# Patient Record
Sex: Male | Born: 1960 | Race: White | Hispanic: No | Smoking: Current every day smoker
Health system: Southern US, Community
[De-identification: ages and names within clinical notes are randomized; demographics above are authoritative.]

## PROBLEM LIST (undated history)

## (undated) DIAGNOSIS — Z720 Tobacco use: Secondary | ICD-10-CM

## (undated) DIAGNOSIS — F1010 Alcohol abuse, uncomplicated: Secondary | ICD-10-CM

## (undated) HISTORY — PX: OTHER SURGICAL HISTORY: SHX169

---

## 2019-05-15 ENCOUNTER — Inpatient Hospital Stay (HOSPITAL_COMMUNITY)
Admission: AD | Admit: 2019-05-15 | Discharge: 2019-05-29 | DRG: 208 | Disposition: E | Payer: BLUE CROSS/BLUE SHIELD | Source: Other Acute Inpatient Hospital | Attending: Pulmonary Disease | Admitting: Pulmonary Disease

## 2019-05-15 ENCOUNTER — Inpatient Hospital Stay (HOSPITAL_COMMUNITY): Payer: BLUE CROSS/BLUE SHIELD

## 2019-05-15 ENCOUNTER — Encounter (HOSPITAL_COMMUNITY): Payer: Self-pay | Admitting: Physician Assistant

## 2019-05-15 DIAGNOSIS — R319 Hematuria, unspecified: Secondary | ICD-10-CM | POA: Diagnosis not present

## 2019-05-15 DIAGNOSIS — F1010 Alcohol abuse, uncomplicated: Secondary | ICD-10-CM | POA: Diagnosis present

## 2019-05-15 DIAGNOSIS — Z8249 Family history of ischemic heart disease and other diseases of the circulatory system: Secondary | ICD-10-CM | POA: Diagnosis not present

## 2019-05-15 DIAGNOSIS — G92 Toxic encephalopathy: Secondary | ICD-10-CM | POA: Diagnosis not present

## 2019-05-15 DIAGNOSIS — J9690 Respiratory failure, unspecified, unspecified whether with hypoxia or hypercapnia: Secondary | ICD-10-CM

## 2019-05-15 DIAGNOSIS — F1020 Alcohol dependence, uncomplicated: Secondary | ICD-10-CM

## 2019-05-15 DIAGNOSIS — J9601 Acute respiratory failure with hypoxia: Principal | ICD-10-CM

## 2019-05-15 DIAGNOSIS — G931 Anoxic brain damage, not elsewhere classified: Secondary | ICD-10-CM | POA: Diagnosis not present

## 2019-05-15 DIAGNOSIS — E8770 Fluid overload, unspecified: Secondary | ICD-10-CM | POA: Diagnosis not present

## 2019-05-15 DIAGNOSIS — Z978 Presence of other specified devices: Secondary | ICD-10-CM

## 2019-05-15 DIAGNOSIS — I214 Non-ST elevation (NSTEMI) myocardial infarction: Secondary | ICD-10-CM | POA: Diagnosis not present

## 2019-05-15 DIAGNOSIS — I4510 Unspecified right bundle-branch block: Secondary | ICD-10-CM | POA: Diagnosis present

## 2019-05-15 DIAGNOSIS — Z85828 Personal history of other malignant neoplasm of skin: Secondary | ICD-10-CM | POA: Diagnosis not present

## 2019-05-15 DIAGNOSIS — Z825 Family history of asthma and other chronic lower respiratory diseases: Secondary | ICD-10-CM | POA: Diagnosis not present

## 2019-05-15 DIAGNOSIS — F17210 Nicotine dependence, cigarettes, uncomplicated: Secondary | ICD-10-CM | POA: Diagnosis not present

## 2019-05-15 DIAGNOSIS — Z20828 Contact with and (suspected) exposure to other viral communicable diseases: Secondary | ICD-10-CM | POA: Diagnosis not present

## 2019-05-15 DIAGNOSIS — Z823 Family history of stroke: Secondary | ICD-10-CM

## 2019-05-15 DIAGNOSIS — J181 Lobar pneumonia, unspecified organism: Secondary | ICD-10-CM

## 2019-05-15 DIAGNOSIS — E872 Acidosis: Secondary | ICD-10-CM | POA: Diagnosis not present

## 2019-05-15 DIAGNOSIS — Z515 Encounter for palliative care: Secondary | ICD-10-CM | POA: Diagnosis not present

## 2019-05-15 DIAGNOSIS — N179 Acute kidney failure, unspecified: Secondary | ICD-10-CM | POA: Diagnosis not present

## 2019-05-15 DIAGNOSIS — J189 Pneumonia, unspecified organism: Secondary | ICD-10-CM | POA: Diagnosis present

## 2019-05-15 DIAGNOSIS — J9602 Acute respiratory failure with hypercapnia: Secondary | ICD-10-CM

## 2019-05-15 DIAGNOSIS — I420 Dilated cardiomyopathy: Secondary | ICD-10-CM | POA: Diagnosis not present

## 2019-05-15 DIAGNOSIS — R739 Hyperglycemia, unspecified: Secondary | ICD-10-CM | POA: Diagnosis present

## 2019-05-15 DIAGNOSIS — I469 Cardiac arrest, cause unspecified: Secondary | ICD-10-CM

## 2019-05-15 DIAGNOSIS — I519 Heart disease, unspecified: Secondary | ICD-10-CM | POA: Diagnosis not present

## 2019-05-15 DIAGNOSIS — I82409 Acute embolism and thrombosis of unspecified deep veins of unspecified lower extremity: Secondary | ICD-10-CM | POA: Diagnosis not present

## 2019-05-15 DIAGNOSIS — R9431 Abnormal electrocardiogram [ECG] [EKG]: Secondary | ICD-10-CM

## 2019-05-15 HISTORY — DX: Tobacco use: Z72.0

## 2019-05-15 HISTORY — DX: Alcohol abuse, uncomplicated: F10.10

## 2019-05-15 LAB — GLUCOSE, CAPILLARY
Glucose-Capillary: 172 mg/dL — ABNORMAL HIGH (ref 70–99)
Glucose-Capillary: 192 mg/dL — ABNORMAL HIGH (ref 70–99)
Glucose-Capillary: 168 mg/dL — ABNORMAL HIGH (ref 70–99)
Glucose-Capillary: 177 mg/dL — ABNORMAL HIGH (ref 70–99)

## 2019-05-15 LAB — POCT I-STAT 7, (LYTES, BLD GAS, ICA,H+H)
Acid-base deficit: 4 mmol/L — ABNORMAL HIGH (ref 0.0–2.0)
Acid-base deficit: 3 mmol/L — ABNORMAL HIGH (ref 0.0–2.0)
Bicarbonate: 22.8 mmol/L (ref 20.0–28.0)
Bicarbonate: 22.2 mmol/L (ref 20.0–28.0)
Calcium, Ion: 1.12 mmol/L — ABNORMAL LOW (ref 1.15–1.40)
Calcium, Ion: 1.1 mmol/L — ABNORMAL LOW (ref 1.15–1.40)
HCT: 43 % (ref 39.0–52.0)
HCT: 43 % (ref 39.0–52.0)
Hemoglobin: 14.6 g/dL (ref 13.0–17.0)
Hemoglobin: 14.6 g/dL (ref 13.0–17.0)
O2 Saturation: 86 %
O2 Saturation: 98 %
Potassium: 5.1 mmol/L (ref 3.5–5.1)
Potassium: 4.9 mmol/L (ref 3.5–5.1)
Sodium: 137 mmol/L (ref 135–145)
Sodium: 138 mmol/L (ref 135–145)
TCO2: 24 mmol/L (ref 22–32)
TCO2: 23 mmol/L (ref 22–32)
pCO2 arterial: 46.3 mmHg (ref 32.0–48.0)
pCO2 arterial: 39.8 mmHg (ref 32.0–48.0)
pH, Arterial: 7.3 — ABNORMAL LOW (ref 7.350–7.450)
pH, Arterial: 7.355 (ref 7.350–7.450)
pO2, Arterial: 57 mmHg — ABNORMAL LOW (ref 83.0–108.0)
pO2, Arterial: 109 mmHg — ABNORMAL HIGH (ref 83.0–108.0)

## 2019-05-15 LAB — RESPIRATORY PANEL BY PCR

## 2019-05-15 LAB — PROCALCITONIN: Procalcitonin: 1.9 ng/mL

## 2019-05-15 LAB — LACTIC ACID, PLASMA
Lactic Acid, Venous: 3.2 mmol/L (ref 0.5–1.9)
Lactic Acid, Venous: 2.5 mmol/L (ref 0.5–1.9)

## 2019-05-15 LAB — RENAL FUNCTION PANEL
Albumin: 3.2 g/dL — ABNORMAL LOW (ref 3.5–5.0)
Anion gap: 14 (ref 5–15)
BUN: 14 mg/dL (ref 6–20)
CO2: 18 mmol/L — ABNORMAL LOW (ref 22–32)
Calcium: 7.8 mg/dL — ABNORMAL LOW (ref 8.9–10.3)
Chloride: 104 mmol/L (ref 98–111)
Creatinine, Ser: 1.28 mg/dL — ABNORMAL HIGH (ref 0.61–1.24)
GFR calc Af Amer: >60 mL/min (ref >60)
GFR calc non Af Amer: >60 mL/min (ref >60)
Glucose, Bld: 188 mg/dL — ABNORMAL HIGH (ref 70–99)
Phosphorus: 2 mg/dL — ABNORMAL LOW (ref 2.5–4.6)
Potassium: 5.1 mmol/L (ref 3.5–5.1)
Sodium: 136 mmol/L (ref 135–145)

## 2019-05-15 LAB — FERRITIN: Ferritin: 535 ng/mL — ABNORMAL HIGH (ref 24–336)

## 2019-05-15 LAB — HEMOGLOBIN A1C
Hgb A1c MFr Bld: 5.5 % (ref 4.8–5.6)
Mean Plasma Glucose: 111.15 mg/dL

## 2019-05-15 LAB — TROPONIN I
Troponin I: 1.5 ng/mL (ref <0.03)
Troponin I: 3.14 ng/mL (ref <0.03)

## 2019-05-15 LAB — AMMONIA: Ammonia: 43 umol/L — ABNORMAL HIGH (ref 9–35)

## 2019-05-15 LAB — LACTATE DEHYDROGENASE: LDH: 262 U/L — ABNORMAL HIGH (ref 98–192)

## 2019-05-15 LAB — STREP PNEUMONIAE URINARY ANTIGEN: Strep Pneumo Urinary Antigen: NEGATIVE

## 2019-05-15 LAB — MRSA PCR SCREENING: MRSA by PCR: NEGATIVE

## 2019-05-15 LAB — MAGNESIUM: Magnesium: 2.3 mg/dL (ref 1.7–2.4)

## 2019-05-15 LAB — C-REACTIVE PROTEIN: CRP: 0.9 mg/dL (ref <1.0)

## 2019-05-15 MED ORDER — IPRATROPIUM-ALBUTEROL 0.5-2.5 (3) MG/3ML IN SOLN
3.0000 mL | Freq: Four times a day (QID) | RESPIRATORY_TRACT | Status: DC
  Administered 2019-05-15 – 2019-05-18 (×11): 3 mL via RESPIRATORY_TRACT
  Filled 2019-05-15 (×11): qty 3

## 2019-05-15 MED ORDER — ALBUTEROL SULFATE (2.5 MG/3ML) 0.083% IN NEBU
2.5000 mg | INHALATION_SOLUTION | RESPIRATORY_TRACT | Status: DC | PRN
  Administered 2019-05-17: 13:00:00 2.5 mg via RESPIRATORY_TRACT
  Filled 2019-05-15 (×2): qty 3

## 2019-05-15 MED ORDER — FENTANYL CITRATE (PF) 100 MCG/2ML IJ SOLN
50.0000 ug | INTRAMUSCULAR | Status: DC | PRN
  Administered 2019-05-15 – 2019-05-18 (×18): 100 ug via INTRAVENOUS
  Filled 2019-05-15 (×6): qty 2
  Filled 2019-05-15: qty 4
  Filled 2019-05-15 (×11): qty 2

## 2019-05-15 MED ORDER — SODIUM CHLORIDE 0.9 % IV SOLN
INTRAVENOUS | Status: DC
  Administered 2019-05-15 – 2019-05-17 (×4): via INTRAVENOUS

## 2019-05-15 MED ORDER — THIAMINE HCL 100 MG/ML IJ SOLN
100.0000 mg | Freq: Every day | INTRAMUSCULAR | Status: DC
  Administered 2019-05-15 – 2019-05-18 (×4): 100 mg via INTRAVENOUS
  Filled 2019-05-15 (×4): qty 2

## 2019-05-15 MED ORDER — HEPARIN SODIUM (PORCINE) 5000 UNIT/ML IJ SOLN
5000.0000 [IU] | Freq: Three times a day (TID) | INTRAMUSCULAR | Status: DC
  Administered 2019-05-15 – 2019-05-16 (×2): 5000 [IU] via SUBCUTANEOUS
  Filled 2019-05-15 (×2): qty 1

## 2019-05-15 MED ORDER — PANTOPRAZOLE SODIUM 40 MG IV SOLR
40.0000 mg | Freq: Every day | INTRAVENOUS | Status: DC
  Administered 2019-05-15 – 2019-05-17 (×3): 40 mg via INTRAVENOUS
  Filled 2019-05-15 (×4): qty 40

## 2019-05-15 MED ORDER — PROPOFOL 1000 MG/100ML IV EMUL
0.0000 ug/kg/min | INTRAVENOUS | Status: DC
  Administered 2019-05-15: 40 ug/kg/min via INTRAVENOUS
  Administered 2019-05-15 – 2019-05-16 (×3): 50 ug/kg/min via INTRAVENOUS
  Administered 2019-05-16: 25 ug/kg/min via INTRAVENOUS
  Administered 2019-05-16: 05:00:00 50 ug/kg/min via INTRAVENOUS
  Filled 2019-05-15 (×4): qty 100

## 2019-05-15 MED ORDER — PROPOFOL 1000 MG/100ML IV EMUL
INTRAVENOUS | Status: AC
  Administered 2019-05-15: 14:00:00 50 ug/kg/min via INTRAVENOUS
  Filled 2019-05-15: qty 100

## 2019-05-15 MED ORDER — ASPIRIN 81 MG PO CHEW
81.0000 mg | CHEWABLE_TABLET | Freq: Every day | ORAL | Status: DC
  Administered 2019-05-16 – 2019-05-18 (×3): 81 mg via ORAL
  Filled 2019-05-15 (×3): qty 1

## 2019-05-15 MED ORDER — CHLORHEXIDINE GLUCONATE CLOTH 2 % EX PADS
6.0000 | MEDICATED_PAD | Freq: Every day | CUTANEOUS | Status: DC
  Administered 2019-05-15 – 2019-05-18 (×3): 6 via TOPICAL

## 2019-05-15 MED ORDER — ORAL CARE MOUTH RINSE
15.0000 mL | OROMUCOSAL | Status: DC
  Administered 2019-05-15 – 2019-05-18 (×28): 15 mL via OROMUCOSAL

## 2019-05-15 MED ORDER — FOLIC ACID 5 MG/ML IJ SOLN
1.0000 mg | Freq: Every day | INTRAMUSCULAR | Status: DC
  Administered 2019-05-15 – 2019-05-18 (×4): 1 mg via INTRAVENOUS
  Filled 2019-05-15 (×4): qty 0.2

## 2019-05-15 MED ORDER — INSULIN ASPART 100 UNIT/ML ~~LOC~~ SOLN
0.0000 [IU] | SUBCUTANEOUS | Status: DC
  Administered 2019-05-15 – 2019-05-16 (×4): 2 [IU] via SUBCUTANEOUS
  Administered 2019-05-16: 1 [IU] via SUBCUTANEOUS
  Administered 2019-05-16: 2 [IU] via SUBCUTANEOUS
  Administered 2019-05-16 – 2019-05-17 (×6): 1 [IU] via SUBCUTANEOUS
  Administered 2019-05-18 (×2): 2 [IU] via SUBCUTANEOUS

## 2019-05-15 MED ORDER — FENTANYL CITRATE (PF) 100 MCG/2ML IJ SOLN
50.0000 ug | INTRAMUSCULAR | Status: AC | PRN
  Administered 2019-05-16 – 2019-05-18 (×3): 50 ug via INTRAVENOUS
  Filled 2019-05-15 (×2): qty 2

## 2019-05-15 MED ORDER — ASPIRIN 81 MG PO CHEW
324.0000 mg | CHEWABLE_TABLET | Freq: Once | ORAL | Status: AC
  Administered 2019-05-15: 324 mg
  Filled 2019-05-15: qty 4

## 2019-05-15 MED ORDER — CHLORHEXIDINE GLUCONATE 0.12% ORAL RINSE (MEDLINE KIT)
15.0000 mL | Freq: Two times a day (BID) | OROMUCOSAL | Status: DC
  Administered 2019-05-15 – 2019-05-18 (×6): 15 mL via OROMUCOSAL

## 2019-05-15 NOTE — Consult Note (Addendum)
Cardiology Consultation:   Patient ID: Jermaine Everett MRN: [id number]; DOB: Mar 22, 1961  Admit date: 05/14/2019 Date of Consult: 05/24/2019  Primary Care Provider: System, Pcp Not In Primary Cardiologist: New  Primary Electrophysiologist:  None    Patient Profile:   Jermaine Everett is a 58 y.o. male with a hx of EtOH abuse and tobacco abuse who is being seen today for the evaluation of cardiac arrest at the request of Dr. Ander Slade.  History of Present Illness:   [name] is a 58 year old male with past medical history of EtOH and tobacco abuse.  Family reported patient has not been seen by a physician for over 20 years.  He drinks on average of 10-12 beers on a daily basis.  He currently works in a [employer] operational officer, there has been concerned that the place he works and had reported positive COVID-19 cases however patient himself has not had any recent symptoms.  He lives with one of his son.  He was found on the side of the road this morning by a coworker.  He was minimally responsive at the time.  However by EMS arrival, he became unresponsive and had PEA arrest.  He achieved ROSC after 1 rounds of CPR and epi.  He was intubated in the field via a King airway.  He was initially taken to [facility].  Initial blood work on arrival showed white blood cell count of 18.9, hemoglobin 16.5, platelet 146.  Sodium potassium were normal.  Hemoglobin 1.3.  Glucose 331.  Vital signs arrival included temperature of 98.2.  Pulse 109.  Respiratory rate 20.  BP 123/60.  Pulse 93.  proBNP came back elevated at 1480.  Borderline elevated amylase 112 with normal lipase.  Myoglobin elevated at 164.4.  Normal liver function, initial lactic acid was 12.1, subsequent lactic acid came down to 3.3.  Urinalysis showed 3+ glucose, 2+ protein, 2+ occult blood, negative nitrite.  D-dimer elevated at 3101 (normal range less than 500) Initial troponin borderline elevated to 0.29.  CPK total was possible MB 153 which  was within normal range.  pH 7.15, PCO2 70, PO2 71.  Blood cultures were obtained however results currently pending.  Chest x-ray showed airway tube in place without pneumothorax, patchy opacity in mid lung and the right base region suspect a degree of multifocal pneumonia.  Abdominal x-ray showed nasogastric tube without any bowel obstruction.  COVID-19 test was negative EKG showed J-point depression in lateral leads with sinus tachycardia.   Patient was subsequently transferred to [facility] for further evaluation.  Initial EKG on arrival shows sinus rhythm with heart rate 78, T wave inversion in the lateral leads, q wave in anterior leads. Baseline incomplete right bundle branch block.  Troponin [facility] has increased to 1.5.  Lactic acid stable at 3.2.  Procalcitonin 1.9.  Cardiology has been consulted for potential cardiac arrest.  CT of the head is currently pending.  Talking with his son and the wife, patient did have an episode of paroxysmal nocturnal dyspnea 2 weeks ago when he woke up in the middle of the night gasping for air.  This lasted roughly 3 hours before resolving and has not recurred since.  His family was not aware of any recent chest discomfort.   Past Medical History:  Diagnosis Date   ETOH abuse    Tobacco abuse     Past Surgical History:  Procedure Laterality Date   skin cancer removal  Home Medications:  Prior to Admission medications   Not on File    Inpatient Medications: Scheduled Meds:  [START ON 05/16/2019] aspirin  81 mg Oral Daily   chlorhexidine gluconate (MEDLINE KIT)  15 mL Mouth Rinse BID   [START ON 05/16/2019] Chlorhexidine Gluconate Cloth  6 each Topical R8309   folic acid  1 mg Intravenous Daily   heparin  5,000 Units Subcutaneous Q8H   insulin aspart  0-9 Units Subcutaneous Q4H   ipratropium-albuterol  3 mL Nebulization Q6H   mouth rinse  15 mL Mouth Rinse 10 times per day   pantoprazole (PROTONIX) IV  40 mg  Intravenous QHS   thiamine  100 mg Intravenous Daily   Continuous Infusions:  sodium chloride 75 mL/hr at 05/03/2019 1600   propofol (DIPRIVAN) infusion 50 mcg/kg/min (05/26/2019 1655)   PRN Meds: albuterol, fentaNYL (SUBLIMAZE) injection, fentaNYL (SUBLIMAZE) injection  Allergies:   Not on File  Social History:   Social History   Socioeconomic History   Marital status: Divorced    Spouse name: Not on file   Number of children: Not on file   Years of education: Not on file   Highest education level: Not on file  Occupational History   Not on file  Social Needs   Financial resource strain: Not on file   Food insecurity:    Worry: Not on file    Inability: Not on file   Transportation needs:    Medical: Not on file    Non-medical: Not on file  Tobacco Use   Smoking status: Current Every Day Smoker    Types: Cigarettes   Smokeless tobacco: Current User   Tobacco comment: 2-3 packes per day since age 28  Substance and Sexual Activity   Alcohol use: Yes    Comment: 10-12 beers per night   Drug use: Never   Sexual activity: Not on file  Lifestyle   Physical activity:    Days per week: Not on file    Minutes per session: Not on file   Stress: Not on file  Relationships   Social connections:    Talks on phone: Not on file    Gets together: Not on file    Attends religious service: Not on file    Active member of club or organization: Not on file    Attends meetings of clubs or organizations: Not on file    Relationship status: Not on file   Intimate partner violence:    Fear of current or ex partner: Not on file    Emotionally abused: Not on file    Physically abused: Not on file    Forced sexual activity: Not on file  Other Topics Concern   Not on file  Social History Narrative   Not on file    Family History:    Family History  Problem Relation Age of Onset   Heart disease Mother        heart valve issue   Stroke Father     Emphysema Maternal Grandfather      ROS:  Please see the history of present illness.   All other ROS reviewed and negative.     Physical Exam/Data:   Vitals:   05/06/2019 1530 05/11/2019 1600 05/25/2019 1630 05/10/2019 1700  BP: 109/75 115/77 120/72 129/78  Pulse: 77 78 77 76  Resp: '18 12 20 20  ' Temp:      TempSrc:      SpO2: 98% 98% 98% 99%  Weight:  Height:        Intake/Output Summary (Last 24 hours) at 05/21/2019 1733 Last data filed at 05/10/2019 1600 Gross per 24 hour  Intake 131.86 ml  Output --  Net 131.86 ml   Last 3 Weights 05/05/2019  Weight (lbs) 232 lb 5.8 oz  Weight (kg) 105.4 kg     Body mass index is 31.51 kg/m.  General: Intubated and sedated HEENT: normal Lymph: no adenopathy Neck: no JVD Endocrine:  No thryomegaly Vascular: No carotid bruits; FA pulses 2+ bilaterally without bruits  Cardiac:  normal S1, S2; RRR; no murmur Lungs: Intubated: Anterior exam shows lungs is clear Abd: soft, no hepatomegaly  Ext: no edema  Musculoskeletal:  No deformities Skin: warm and dry  Neuro:  Unable to assess Psych:  Unable to assess  EKG:  The EKG was personally reviewed and demonstrates: Normal sinus rhythm, incomplete right bundle branch block, T wave inversion in the lateral leads, q wave in anterior leads Telemetry:  Telemetry was personally reviewed and demonstrates: Normal sinus rhythm.  Relevant CV Studies:  N/A  Laboratory Data:  Chemistry Recent Labs  Lab 05/28/2019 1459 05/05/2019 1531  NA 136 137  K 5.1 5.1  CL 104  --   CO2 18*  --   GLUCOSE 188*  --   BUN 14  --   CREATININE 1.28*  --   CALCIUM 7.8*  --   GFRNONAA >60  --   GFRAA >60  --   ANIONGAP 14  --     Recent Labs  Lab 05/23/2019 1459  ALBUMIN 3.2*   Hematology Recent Labs  Lab 05/27/2019 1531  HGB 14.6  HCT 43.0   Cardiac Enzymes Recent Labs  Lab 05/08/2019 1456  TROPONINI 1.50*   No results for input(s): TROPIPOC in the last 168 hours.  BNPNo results for input(s):  BNP, PROBNP in the last 168 hours.  DDimer No results for input(s): DDIMER in the last 168 hours.  Radiology/Studies:  Dg Chest Port 1 View  Result Date: 05/14/2019 CLINICAL DATA:  Intubation. EXAM: PORTABLE CHEST 1 VIEW COMPARISON:  None. FINDINGS: The heart size and mediastinal contours are within normal limits. Endotracheal and nasogastric tubes are in grossly good position. No pneumothorax is noted. Mild bibasilar subsegmental atelectasis is noted with minimal left pleural effusion. The visualized skeletal structures are unremarkable. IMPRESSION: Endotracheal and nasogastric tubes are in grossly good position. Mild bibasilar subsegmental atelectasis is noted with minimal left pleural effusion. Electronically Signed   By: Marijo Conception M.D.   On: 05/06/2019 16:26    Assessment and Plan:   1. Possible cardiac arrest: Q wave in the anterior leads, pending echocardiogram. His presentation concerning for CHF respiratory arrest, although physical exam did not show significant sign of volume overload  2. Multifocal pneumonia: Seen on outside chest x-ray. Per PCCM  3. Tobacco abuse: per family, he has been smoking 2-3 packs per day since age 88  4. EtOH abuse: drinks about 10-12 beers a day      For questions or updates, please contact [place] Please consult [email or web address] for contact info under     Signed, Almyra Deforest, [place]  05/24/2019 5:33 PM   I have seen and examined the patient along with Almyra Deforest, PA.   I have reviewed the chart, notes and new data.  I agree with PA/NP's note.  Key new complaints: Intubated, sedated. History from chart. Possible acute event (MI?) 2 weeks ago, subsequent deterioration in respiratory status Key examination changes:  clear lungs, prolonged expiration, nonspecific response to pain only, Babinski upgoing bilaterally Key new findings / data: ECG (no old tracing) suggests od anteroseptal infarction, completed, with anterolateral T wave inversion  suggesting ischemia. BNP markedly elevated . COVID 19 test at [facility] negative. Unable to review CXr images from [facility] ("multifocal pneumonia" could be acute pulmonary edema). Current CXR with L pleural effusion.  PLAN: Check echocardiogram. Suspect delayed presentation of anterior MI with subsequent CHF, in setting of background smoking-related lung disease and alcoholism, complicated by PEA due to acute hypoxic and hypercapnic respiratory failure and lactic acidosis. There are some clinical signs that suggest poor neurological prognosis. Recommend echo as initial evaluation. ASA is appropriate. Heparin only for DVT prophylaxis. Would not proceed with any invasive coronary workup until he has meaningful neurological recovery.  Sanda Klein, MD, [place] [phone] 05/16/2019, 6:05 PM

## 2019-05-15 NOTE — H&P (Addendum)
NAME:  Jermaine Everett, MRN:  [id number], DOB:  01/21/1961, LOS: 0 ADMISSION DATE:  05/25/2019, CONSULTATION DATE:  05/26/2019 REFERRING MD:  [facility], CHIEF COMPLAINT:  Cardiac arrest  Brief History   58 year old male transferred from [facility] ER after being found with witness PEA arrest, ROSC after 1 round of epi.  Since remains intubated but not following commands.  COVID neg.  CXR with concerns for sepsis/  multifocal pneumonia.    History of present illness   HPI obtained from medical chart review and per conversation with patient's son, Jermaine Everett as patient is sedated on mechanical ventilation.   58 year old male with history of long standing tobacco and ETOH use who has not seen a medical provider in years per family.    Son states he complained of SOB at night about 2 weeks ago but since stated he felt better and seemed in his normal state of health last night.  No known reports of fever, chest pain, or shortness breath. Son reports he drinks 10-12 beers on a daily basis and more when off work.  Denies any illicit drugs.  He works in a factory, Biomedical scientist, Scientist, research (medical).  Family report concern as there as been positive COVID cases.  Patient is divorced and has three sons, one of whom he lives with.   He was found on the side of the road in his car this morning by a co-worker, minimally responsive.  On EMS arrival, he went unresponsive and found to be in PEA arrest.  ROSC obtained after 1 round of CPR/ EPI. Taken to [facility] ER around 0710, and remained unresponsive and intubated.  Abnormal labs noted for AG 24, sCr 1.3, glucose 331, Lactic 12 -> 3.3, trop 0.05- 0.29, BNP 1480, Amylase 112, WBC 18.9, normal coags, Ddimer 3101, UDS neg, UA neg, EKG non acute,  CXR concerning for multifocal pneumonia. Emperically started on vancomycin and zosyn, 2L NS, solumedrol, and started on propofol.  Unclear if patient has had purposeful movement.  Transferred to [facility] for further care.   Past Medical  History  Tobacco abuse   Significant Hospital Events   5/18 Cardiac arrest/ tx to [facility]  Consults:   Procedures:  5/18 ETT >>  Significant Diagnostic Tests:  5/18 [facility] >>  Micro Data:  5/18 COVID ([place]) >> neg  5/18 MRSA PCR >> 5/18 BCx2 >> 5/18 trach asp >> 5/18 RVP >>  Antimicrobials:  5/18 vanc x1 5/18 zosyn x1 5/18 ceftriaxone >> 5/18 azithromax  >>  Interim history/subjective:  Arrived on propofol, currently at 60 mcg/kg/min  Objective   Blood pressure 124/87, pulse 86, resp. rate (!) 24, height 6' (1.829 m), weight 105.4 kg, SpO2 98 %.    Vent Mode: PRVC FiO2 (%):  [50 %] 50 % Set Rate:  [18 bmp] 18 bmp Vt Set:  [620 mL] 620 mL PEEP:  [5 cmH20] 5 cmH20  No intake or output data in the 24 hours ending 04/29/2019 1451 Filed Weights   05/25/2019 1333  Weight: 105.4 kg   Examination:  Propofol held for examination General: Well nourished adult male coughing on MV HENT: ETT at 23, OGT, pupils 3/reactive, anicteric  Lungs: Lungs clear throughout, no wheeze, occasionally breathing above set rate, scant bloody tracheal secretions Cardiovascular: SR, no murmur Abdomen: obese, +bs, soft Extremities: w/d, no LE edema Neuro: minimal withdrawal to pain in all extremities to noxious stimuli does not f/c, open eyes, - just coughs and bites on ETT with sedation  held GU: foley  Resolved Hospital Problem list    Assessment & Plan:  PEA cardiac arrest  Elevated troponin- ?demand/ r/o NSTEMI - likely precipitated due to respiratory failure - ddx cardiac vs sepsis vs obstructive (PE) P:  Tele monitoring Goal MAP >65, currently normotensive Trend troponin and EKG q 6 x 2 TTE  Trend lactic  Does not require central access at this time Repeat LFTs in am  ASA if CTH neg  Acute respiratory failure Possible Multifocal PNA R/o PE given elevated ddimer Tobacco abuse - longstanding hx  - not significantly hypoxic, currently SpO2 is 98% on 0.5 FiO2 and PEEP 5 P:   Full MV support PRVC 8 cc/kg, rate 18 Goal saturations 94-99% CXR and ABG now VAP bundle Start empiric heparin gtt after CTH results  Will need a CTA PE to r/o PE after gentle hydration/ improvement of sCr Duonebs TID, albuterol PRN See below PAD protocol with propofol and prn fentanyl for RASS goal 0/-1, may need to consider precedex  Daily SBT/ WUA  Leukocytosis - CXR with concern for multifocal PNA - COVID neg, however family has concerns with known positive cases where patient works- less likely given patient has been in his normal state of health without complaints of fever P:  Pan culture with trach asp and RVP  Send urine legionella and strep  Continue with empiric azithro and ceftriaxone coverage Check PCT  Trend WBC/ fever curve  Assess ferritin/ LDH/ CRP Empiric droplet precautions  Acute encephalopathy s/p cardiac arrest  P:  CT Head stat Check ammonia  Ongoing neuro exams Outside of window for TTM, goal would be to prevent fever for the next 48 hours, use cooling blanket prn   AKI- unclear baseline sCr P:  Gentle hydration NS 75 ml/hr in anticipation of CT contrast to r/o PE Renal panel now and mag Trend UOP/ BMP/ Mag Continue foley   ETOH abuse - normal lipase, coags, LFTs - 10-12 beers on days he works, more on days off, not hx of reported withdrawal or seizures  P:  Daily thiamine, folate, MVI Monitor for withdrawal   Hyperglycemia P:  CBG q 4 SSI sensitive  Pending HgbA1c  Best practice:  Diet: NPO, start TF if not extubated 5/19 Pain/Anxiety/Delirium protocol (if indicated): propofol/ prn fentanyl VAP protocol (if indicated): yes DVT prophylaxis: pending CTH GI prophylaxis: PPI Glucose control: CBG q 4, SSI sensitive Mobility: BR Code Status: Full  Family Communication: K[place], [place] ([phone]) spoke with by phone, updated.   Disposition: ICU  Labs   CBC: No results for input(s): WBC, NEUTROABS, HGB, HCT, MCV, PLT in the last 168  hours.  Basic Metabolic Panel: No results for input(s): NA, K, CL, CO2, GLUCOSE, BUN, CREATININE, CALCIUM, MG, PHOS in the last 168 hours. GFR: CrCl cannot be calculated (No successful lab value found.). No results for input(s): PROCALCITON, WBC, LATICACIDVEN in the last 168 hours.  Liver Function Tests: No results for input(s): AST, ALT, ALKPHOS, BILITOT, PROT, ALBUMIN in the last 168 hours. No results for input(s): LIPASE, AMYLASE in the last 168 hours. No results for input(s): AMMONIA in the last 168 hours.  ABG No results found for: PHART, PCO2ART, PO2ART, HCO3, TCO2, ACIDBASEDEF, O2SAT   Coagulation Profile: No results for input(s): INR, PROTIME in the last 168 hours.  Cardiac Enzymes: No results for input(s): CKTOTAL, CKMB, CKMBINDEX, TROPONINI in the last 168 hours.  HbA1C: No results found for: HGBA1C  CBG: No results for input(s): GLUCAP in the  last 168 hours.  Review of Systems:   unable  Past Medical History  He,  has no past medical history on file.   Surgical History   unable  Social History    Smoked cigarettes since he was 58 years old per son.  Drinks 10-12 beers daily on work days and "more" on days off.   Family History   His family history is not on file.   Allergies Allergies not on file   Home Medications  Prior to Admission medications   Not on File     Critical care time: 60 mins    Posey BoyerBrooke Omie Ferger, MSN, AGACNP-BC [facility] Pgr: [phone] or if no answer [phone] 05/02/2019, 3:52 PM

## 2019-05-15 NOTE — Progress Notes (Signed)
Pt transported from 2M07 to CT3 and back on ventilator. No complications, vital signs remained stable throughout.

## 2019-05-15 NOTE — Progress Notes (Signed)
Notified of higher troponin (at OSH 0.05-> 0.29) now 1.5 EKG here showing minimal elevation in aVr and ongoing lateral TWI/ minimal depression w/ LAD, SR in the 70's.   P:  CXR confirms OGT placement ok, will give ASA now Cardiology consulted Continue to trend trop/ EKG Going for [facility] now, if neg will start empiric heparin gtt.     Posey Boyer, MSN, AGACNP-BC [place] [facility] Pgr: [phone] or if no answer [phone] 05/04/2019, 4:36 PM

## 2019-05-16 ENCOUNTER — Inpatient Hospital Stay (HOSPITAL_COMMUNITY): Payer: BLUE CROSS/BLUE SHIELD

## 2019-05-16 DIAGNOSIS — I469 Cardiac arrest, cause unspecified: Secondary | ICD-10-CM

## 2019-05-16 LAB — POCT I-STAT 7, (LYTES, BLD GAS, ICA,H+H)
Acid-base deficit: 2 mmol/L (ref 0.0–2.0)
Bicarbonate: 21.9 mmol/L (ref 20.0–28.0)
Calcium, Ion: 1.2 mmol/L (ref 1.15–1.40)
HCT: 41 % (ref 39.0–52.0)
Hemoglobin: 13.9 g/dL (ref 13.0–17.0)
O2 Saturation: 98 %
Patient temperature: 97.8
Potassium: 4.2 mmol/L (ref 3.5–5.1)
Sodium: 141 mmol/L (ref 135–145)
TCO2: 23 mmol/L (ref 22–32)
pCO2 arterial: 34.1 mmHg (ref 32.0–48.0)
pH, Arterial: 7.415 (ref 7.350–7.450)
pO2, Arterial: 104 mmHg (ref 83.0–108.0)

## 2019-05-16 LAB — BASIC METABOLIC PANEL
Anion gap: 11 (ref 5–15)
BUN: 14 mg/dL (ref 6–20)
CO2: 23 mmol/L (ref 22–32)
Calcium: 8 mg/dL — ABNORMAL LOW (ref 8.9–10.3)
Chloride: 104 mmol/L (ref 98–111)
Creatinine, Ser: 1.12 mg/dL (ref 0.61–1.24)
GFR calc Af Amer: >60 mL/min (ref >60)
GFR calc non Af Amer: >60 mL/min (ref >60)
Glucose, Bld: 179 mg/dL — ABNORMAL HIGH (ref 70–99)
Potassium: 4.1 mmol/L (ref 3.5–5.1)
Sodium: 138 mmol/L (ref 135–145)

## 2019-05-16 LAB — ECHOCARDIOGRAM COMPLETE
Height: 72 in
Weight: 3686.09 oz

## 2019-05-16 LAB — GLUCOSE, CAPILLARY
Glucose-Capillary: 123 mg/dL — ABNORMAL HIGH (ref 70–99)
Glucose-Capillary: 124 mg/dL — ABNORMAL HIGH (ref 70–99)
Glucose-Capillary: 125 mg/dL — ABNORMAL HIGH (ref 70–99)
Glucose-Capillary: 126 mg/dL — ABNORMAL HIGH (ref 70–99)
Glucose-Capillary: 161 mg/dL — ABNORMAL HIGH (ref 70–99)
Glucose-Capillary: 165 mg/dL — ABNORMAL HIGH (ref 70–99)

## 2019-05-16 LAB — TRIGLYCERIDES: Triglycerides: 273 mg/dL — ABNORMAL HIGH (ref <150)

## 2019-05-16 LAB — CBC
HCT: 41.2 % (ref 39.0–52.0)
Hemoglobin: 14 g/dL (ref 13.0–17.0)
MCH: 34.1 pg — ABNORMAL HIGH (ref 26.0–34.0)
MCHC: 34 g/dL (ref 30.0–36.0)
MCV: 100.2 fL — ABNORMAL HIGH (ref 80.0–100.0)
Platelets: 83 10*3/uL — ABNORMAL LOW (ref 150–400)
RBC: 4.11 MIL/uL — ABNORMAL LOW (ref 4.22–5.81)
RDW: 12.7 % (ref 11.5–15.5)
WBC: 16.7 10*3/uL — ABNORMAL HIGH (ref 4.0–10.5)
nRBC: 0 % (ref 0.0–0.2)

## 2019-05-16 LAB — MAGNESIUM: Magnesium: 2.2 mg/dL (ref 1.7–2.4)

## 2019-05-16 LAB — URINE CULTURE: Culture: NO GROWTH

## 2019-05-16 LAB — HEMOGLOBIN AND HEMATOCRIT, BLOOD
HCT: 44.5 % (ref 39.0–52.0)
Hemoglobin: 14.9 g/dL (ref 13.0–17.0)

## 2019-05-16 LAB — HEPARIN LEVEL (UNFRACTIONATED): Heparin Unfractionated: <0.1 IU/mL — ABNORMAL LOW (ref 0.30–0.70)

## 2019-05-16 LAB — TROPONIN I: Troponin I: 2.91 ng/mL (ref <0.03)

## 2019-05-16 LAB — HIV ANTIBODY (ROUTINE TESTING W REFLEX): HIV Screen 4th Generation wRfx: NONREACTIVE

## 2019-05-16 LAB — PHOSPHORUS: Phosphorus: 2.5 mg/dL (ref 2.5–4.6)

## 2019-05-16 MED ORDER — SODIUM CHLORIDE 0.9 % IV SOLN
INTRAVENOUS | Status: DC | PRN
  Administered 2019-05-16 (×2): 250 mL via INTRAVENOUS

## 2019-05-16 MED ORDER — LORAZEPAM 2 MG/ML IJ SOLN
INTRAMUSCULAR | Status: AC
  Administered 2019-05-16: 2 mg
  Filled 2019-05-16: qty 1

## 2019-05-16 MED ORDER — PERFLUTREN LIPID MICROSPHERE
1.0000 mL | INTRAVENOUS | Status: AC | PRN
  Administered 2019-05-16: 14:00:00 1 mL via INTRAVENOUS
  Administered 2019-05-16: 2 mL via INTRAVENOUS
  Filled 2019-05-16: qty 10

## 2019-05-16 MED ORDER — LORAZEPAM 2 MG/ML IJ SOLN
2.0000 mg | Freq: Once | INTRAMUSCULAR | Status: AC
  Administered 2019-05-16: 2 mg via INTRAVENOUS

## 2019-05-16 MED ORDER — HEPARIN (PORCINE) 25000 UT/250ML-% IV SOLN
1400.0000 [IU]/h | INTRAVENOUS | Status: DC
  Administered 2019-05-16: 08:00:00 1400 [IU]/h via INTRAVENOUS
  Filled 2019-05-16: qty 250

## 2019-05-16 MED ORDER — PIPERACILLIN-TAZOBACTAM 3.375 G IVPB
3.3750 g | Freq: Three times a day (TID) | INTRAVENOUS | Status: DC
  Administered 2019-05-16 – 2019-05-17 (×4): 3.375 g via INTRAVENOUS
  Filled 2019-05-16 (×4): qty 50

## 2019-05-16 MED ORDER — IOHEXOL 350 MG/ML SOLN
80.0000 mL | Freq: Once | INTRAVENOUS | Status: AC | PRN
  Administered 2019-05-16: 80 mL via INTRAVENOUS

## 2019-05-16 NOTE — Progress Notes (Signed)
Pt transported on ventilator from 2M07 to CT2 and back without complications.

## 2019-05-16 NOTE — Progress Notes (Signed)
ANTICOAGULATION CONSULT NOTE - Initial Consult  Pharmacy Consult for heparin Indication: chest pain/ACS  No Known Allergies  Patient Measurements: Height: 6' (182.9 cm) Weight: 230 lb 6.1 oz (104.5 kg) IBW/kg (Calculated) : 77.6 Heparin Dosing Weight: 100kg  Vital Signs: Temp: 99.1 F (37.3 C) (05/19 1500) Temp Source: Bladder (05/19 1200) BP: 150/97 (05/19 1500) Pulse Rate: 104 (05/19 1500)  Labs: Recent Labs    06-Jun-2019 1456 06-Jun-2019 1459  June 06, 2019 2111 05/16/19 0245 05/16/19 0346 05/16/19 1507  HGB  --   --    < >  --  14.0 13.9 14.9  HCT  --   --    < >  --  41.2 41.0 44.5  PLT  --   --   --   --  83*  --   --   HEPARINUNFRC  --   --   --   --   --   --  <0.10*  CREATININE  --  1.28*  --   --  1.12  --   --   TROPONINI 1.50*  --   --  3.14* 2.91*  --   --    < > = values in this interval not displayed.    Estimated Creatinine Clearance: 91 mL/min (by C-G formula based on SCr of 1.12 mg/dL).  Assessment: 58yo male presents after PEA arrest, likely related to respiratory failure, troponin found to be elevated, CT head concerning for anoxic injury, to begin heparin for ACS.  significant blood in urine per RN - discussed with CCM to hold for now  Goal of Therapy:  Heparin level 0.3-0.7 units/ml Monitor platelets by anticoagulation protocol: Yes   Plan:  Hold heparin and will follow up in am for resumption pending stopping of bleed  Isaac Bliss, PharmD, BCPS, BCCCP Clinical Pharmacist [phone]  Please check AMION for all [facility] Pharmacy numbers  05/16/2019 4:13 PM

## 2019-05-16 NOTE — Progress Notes (Signed)
eLink Physician-Brief Progress Note Patient Name: Jermaine Everett DOB: [birth year] MRN: [id number]   Date of Service  05/16/2019  HPI/Events of Note  Head CT Scan - Scattered hypoattenuating areas involving the bilateral frontal lobes and left basal ganglia with associated loss of gray-white differentiation is highly concerning for anoxic brain injury in the setting of PEA arrest.  eICU Interventions  Will start Heparin IV infusion per pharmacy consult.      Intervention Category Major Interventions: Other:  Sommer,Steven Dennard Nip 05/16/2019, 6:30 AM

## 2019-05-16 NOTE — Progress Notes (Signed)
Initial Nutrition Assessment  DOCUMENTATION CODES:   Not applicable  INTERVENTION:   If supportive care continues, recommend begin enteral nutrition via OGT:   Vital High Protein at 50 ml/h (1200 ml per day)   Pro-stat 60 ml BID   Provides 1600 kcal, 165 gm protein, 1003 ml free water daily  NUTRITION DIAGNOSIS:   Inadequate oral intake related to inability to eat as evidenced by NPO status.  GOAL:   Provide needs based on ASPEN/SCCM guidelines  MONITOR:   Vent status, Labs, Skin, I & O's  REASON FOR ASSESSMENT:   Ventilator    ASSESSMENT:   58 yo male with PMH of tobacco and alcohol abuse who was admitted s/p PEA arrest. COVID-19 negative, but CXR concerning for sepsis & PNA.    Patient is currently intubated on ventilator support MV: 12.3 L/min Temp (24hrs), Avg:98.2 F (36.8 C), Min:97.2 F (36.2 C), Max:99.7 F (37.6 C)   Labs reviewed. Triglycerides 273 (H) CBG's: 161-165-126-125  Medications reviewed and include folic acid, Novolog, thiamine.   Noted poor prognosis, posturing. CT head consistent with anoxic injury.   NUTRITION - FOCUSED PHYSICAL EXAM:    Most Recent Value  Orbital Region  No depletion  Upper Arm Region  No depletion  Thoracic and Lumbar Region  Unable to assess  Buccal Region  Unable to assess  Temple Region  No depletion  Clavicle Bone Region  No depletion  Clavicle and Acromion Bone Region  No depletion  Scapular Bone Region  Unable to assess  Dorsal Hand  No depletion  Anterior Thigh Region  No depletion  Posterior Calf Region  No depletion  Edema (RD Assessment)  Moderate  Hair  Reviewed  Eyes  Unable to assess  Mouth  Unable to assess  Skin  Reviewed  Nails  Reviewed       Diet Order:   Diet Order            Diet NPO time specified  Diet effective now              EDUCATION NEEDS:   No education needs have been identified at this time  Skin:  Skin Assessment: Reviewed RN Assessment  Last BM:  no  BM documented  Height:   Ht Readings from Last 1 Encounters:  05/16/19 6' (1.829 m)    Weight:   Wt Readings from Last 1 Encounters:  05/16/19 104.5 kg    Ideal Body Weight:  80.9 kg  BMI:  Body mass index is 31.25 kg/m.  Estimated Nutritional Needs:   Kcal:  1400-1600  Protein:  162 gm  Fluid:  2.4 L    Joaquin Courts, RD, LDN, CNSC Pager [phone] After Hours Pager [phone]

## 2019-05-16 NOTE — Progress Notes (Signed)
Progress Note  Patient Name: Jermaine Everett Date of Encounter: 05/16/2019  Primary Cardiologist: New to Dr. Sallyanne Kuster  Subjective   Responds only to painful stimuli with nonspecific posturing, possibly decorticate.  Inpatient Medications    Scheduled Meds:  aspirin  81 mg Oral Daily   chlorhexidine gluconate (MEDLINE KIT)  15 mL Mouth Rinse BID   Chlorhexidine Gluconate Cloth  6 each Topical J2426   folic acid  1 mg Intravenous Daily   insulin aspart  0-9 Units Subcutaneous Q4H   ipratropium-albuterol  3 mL Nebulization Q6H   mouth rinse  15 mL Mouth Rinse 10 times per day   pantoprazole (PROTONIX) IV  40 mg Intravenous QHS   thiamine  100 mg Intravenous Daily   Continuous Infusions:  sodium chloride 75 mL/hr at 05/16/19 0800   heparin 1,400 Units/hr (05/16/19 0805)   piperacillin-tazobactam (ZOSYN)  IV     propofol (DIPRIVAN) infusion 25 mcg/kg/min (05/16/19 0800)   PRN Meds: albuterol, fentaNYL (SUBLIMAZE) injection, fentaNYL (SUBLIMAZE) injection   Vital Signs    Vitals:   05/16/19 0600 05/16/19 0700 05/16/19 0732 05/16/19 0800  BP: 120/76 115/64  133/79  Pulse: 86 71  87  Resp: (!) 21 20  (!) 26  Temp: (!) 97.3 F (36.3 C) (!) 97.2 F (36.2 C) (!) 97.4 F (36.3 C) (!) 97.2 F (36.2 C)  TempSrc:   Axillary Bladder  SpO2: 96% 97%  100%  Weight:      Height:        Intake/Output Summary (Last 24 hours) at 05/16/2019 0829 Last data filed at 05/16/2019 0800 Gross per 24 hour  Intake 1789.14 ml  Output 2450 ml  Net -660.86 ml   Filed Weights   05/28/2019 1333 05/16/19 0500  Weight: 105.4 kg 104.5 kg    Telemetry    Sinus rhythm- Personally Reviewed  Physical Exam  Physical exam per MD GEN: No acute distress.   Neck: No JVD, no carotid bruits Cardiac:  RRR, no murmurs, rubs, or gallops.  Respiratory: Clear to auscultation bilaterally, no wheezes/ rales/ rhonchi GI: NABS, Soft, nontender, non-distended  MS: No edema; No  deformity. Neuro:  Nonfocal, moving all extremities spontaneously Psych: Normal affect   Labs    Chemistry Recent Labs  Lab 05/12/2019 1459  05/16/2019 1841 05/16/19 0245 05/16/19 0346  NA 136   < > 138 138 141  K 5.1   < > 4.9 4.1 4.2  CL 104  --   --  104  --   CO2 18*  --   --  23  --   GLUCOSE 188*  --   --  179*  --   BUN 14  --   --  14  --   CREATININE 1.28*  --   --  1.12  --   CALCIUM 7.8*  --   --  8.0*  --   ALBUMIN 3.2*  --   --   --   --   GFRNONAA >60  --   --  >60  --   GFRAA >60  --   --  >60  --   ANIONGAP 14  --   --  11  --    < > = values in this interval not displayed.     Hematology Recent Labs  Lab 05/03/2019 1841 05/16/19 0245 05/16/19 0346  WBC  --  16.7*  --   RBC  --  4.11*  --   HGB 14.6 14.0 13.9  HCT 43.0 41.2 41.0  MCV  --  100.2*  --   MCH  --  34.1*  --   MCHC  --  34.0  --   RDW  --  12.7  --   PLT  --  83*  --     Cardiac Enzymes Recent Labs  Lab 05/04/2019 1456 05/14/2019 2111 05/16/19 0245  TROPONINI 1.50* 3.14* 2.91*   No results for input(s): TROPIPOC in the last 168 hours.   BNPNo results for input(s): BNP, PROBNP in the last 168 hours.   DDimer No results for input(s): DDIMER in the last 168 hours.   Radiology    Ct Head Wo Contrast  Result Date: 05/16/2019 CLINICAL DATA:  Acute respiratory arrest.  Encephalopathy. EXAM: CT HEAD WITHOUT CONTRAST TECHNIQUE: Contiguous axial images were obtained from the base of the skull through the vertex without intravenous contrast. COMPARISON:  None. FINDINGS: Brain: There is scattered areas of decreased attenuation involving the bilateral frontal lobes. For example there is a 2.6 x 2.6 cm hypoattenuating area in the right frontal lobe with associated loss of gray-white differentiation. There is hypoattenuation involving the left basal ganglia. There is no intracranial hemorrhage. No evidence of a midline shift. Vascular: No hyperdense vessel or unexpected calcification. Skull: Normal.  Negative for fracture or focal lesion. Sinuses/Orbits: There is maxillary mucosal thickening bilaterally. There is thickening of the ethmoid air cells and frontal sinuses. The mastoid air cells are essentially clear. Other: None. IMPRESSION: Scattered hypoattenuating areas involving the bilateral frontal lobes and left basal ganglia with associated loss of gray-white differentiation is highly concerning for anoxic brain injury in the setting of PEA arrest. This can be further evaluated with MRI. These results were called by telephone at the time of interpretation on 05/01/2019 at 5:54 pm to RN Braddock , who verbally acknowledged these results. Electronically Signed   By: Constance Holster M.D.   On: 05/27/2019 17:55   Dg Chest Port 1 View  Result Date: 05/16/2019 CLINICAL DATA:  Intubation. EXAM: PORTABLE CHEST 1 VIEW COMPARISON:  None. FINDINGS: The heart size and mediastinal contours are within normal limits. Endotracheal and nasogastric tubes are in grossly good position. No pneumothorax is noted. Mild bibasilar subsegmental atelectasis is noted with minimal left pleural effusion. The visualized skeletal structures are unremarkable. IMPRESSION: Endotracheal and nasogastric tubes are in grossly good position. Mild bibasilar subsegmental atelectasis is noted with minimal left pleural effusion. Electronically Signed   By: Marijo Conception M.D.   On: 05/17/2019 16:26    Cardiac Studies   Echo pending  Patient Profile     58 y.o. male with a hx of EtOH abuse and tobacco abuse who is being followed by cardiology for the evaluation of cardiac arrest   Assessment & Plan    1. PEA arrest: ROSC achieved after 1 round of epi. Suspect delayed presentation of anterior MI with subsequent CHF, in setting of background smoking-related lung disease and alcoholism, complicated by PEA due to acute hypoxic and hypercapnic respiratory failure and lactic acidosis. CT Head suggestive of anoxic brain injury. Echo  pending to assess LV function and wall motion abnormalities. No plans for invasive ischemic work up at this time.  - Will follow-up echo  For questions or updates, please contact Rosa Please consult [email or web address] for contact info under Cardiology/STEMI.      Signed, Abigail Butts, PA-C  05/16/2019, 8:29 AM   [phone]  I have seen and examined the patient along with Lorelee Cover.  Kroeger, PA-C .  I have reviewed the chart, notes and new data.  I agree with PA's note.  Key new complaints: Unfortunately, absence of neurological improvement despite discontinuation of sedation, possible decorticate posturing, findings on CT head and prolonged/unknown downtime all have negative prognostic implications. Key examination changes: No overt evidence of hypervolemia Key new findings / data: Echo pending.  CT head consistent with anoxic injury.  PLAN: We will wait for echocardiogram to assess LV function and wall motion, but unless there is some sign of neurological improvement, we will not be performing additional cardiac evaluation.  Sanda Klein, MD, [place] [phone] 05/16/2019, 12:22 PM

## 2019-05-16 NOTE — Procedures (Signed)
ELECTROENCEPHALOGRAM REPORT   Patient: Jermaine Everett       Room #: 1E07H EEG No. ID: [id number] Age: 58 y.o.        Sex: male Referring Physician: Wynona Neat Report Date:  05/16/2019        Interpreting Physician: Thana Farr  History: Jermaine Everett is an 57 y.o. male s/p arrest  Medications:  ASA, Insulin, Protonix, Zosyn, Thiamine, Diprovan  Conditions of Recording:  This is a 21 channel routine scalp EEG performed with bipolar and monopolar montages arranged in accordance to the international 10/20 system of electrode placement. One channel was dedicated to EKG recording.  The patient is in the intubated and sedated state.  Description:  The background activity is markedly attenuated.  With increase in sensitivity the background activity is slow and poorly organized.  It consists of a poorly organized polymorphic delta activity that is diffusely distributed.  There is noted some intermixed theta activity noted at times as well.  Some infrequent left sharp waves are noted with phase reversal at T5.  Hyperventilation and intermittent photic stimulation were not performed.  IMPRESSION: This is an abnormal electroencephalogram secondary to general background slowing with marked attenuation.   Also noted are sharp waves over the left temporal region with phase reversal at T5.     Thana Farr, MD Neurology [phone] 05/16/2019, 6:33 PM

## 2019-05-16 NOTE — Progress Notes (Signed)
  Echocardiogram 2D Echocardiogram has been performed.  Jermaine Everett 05/16/2019, 2:10 PM

## 2019-05-16 NOTE — Progress Notes (Signed)
EEG Complete  Results Pending 

## 2019-05-16 NOTE — Progress Notes (Addendum)
NAME:  Jermaine Everett, MRN:  [id number], DOB:  1961/06/26, LOS: 1 ADMISSION DATE:  05/20/2019, CONSULTATION DATE:  05/01/2019 REFERRING MD:  [facility], CHIEF COMPLAINT:  Cardiac arrest  Brief History   58 year old male transferred from [facility] ER after being found with witness PEA arrest, ROSC after 1 round of epi.  Since remains intubated but not following commands.  COVID neg.  CXR with concerns for sepsis/  multifocal pneumonia.    History of present illness   HPI obtained from medical chart review and per conversation with patient's son, Jermaine Everett.   58 year old male with history of long standing tobacco and ETOH use who has not seen a medical provider in years per family.    Son states he complained of SOB at night about 2 weeks ago but since stated he felt better and seemed in his normal state of health last night.  No known reports of fever, chest pain, or shortness breath. Son reports he drinks 10-12 beers on a daily basis and more when off work.  Denies any illicit drugs.  He works in a factory, Biomedical scientist, Scientist, research (medical).  Family report concern as there as been positive COVID cases.  Patient is divorced and has three sons, one of whom he lives with.   He was found on the side of the road in his car this morning by a co-worker, minimally responsive.  On EMS arrival, he went unresponsive and found to be in PEA arrest.  ROSC obtained after 1 round of CPR/ EPI. Taken to [place] ER around 0710, and remained unresponsive and intubated.  Abnormal labs noted for AG 24, sCr 1.3, glucose 331, Lactic 12 -> 3.3, trop 0.05- 0.29, BNP 1480, Amylase 112, WBC 18.9, normal coags, Ddimer 3101, UDS neg, UA neg, EKG non acute,  CXR concerning for multifocal pneumonia. Emperically started on vancomycin and zosyn, 2L NS, solumedrol, and started on propofol.  COVID negative.  Unclear if patient has had purposeful movement.  Transferred to [facility] for further care.   Past Medical History  Tobacco abuse   Significant Hospital Events   5/18 Cardiac arrest/ tx to [facility]  Consults:  Cardiology  Procedures:  5/18 ETT >>  Significant Diagnostic Tests:  5/18 [facility] >> highly concerning for anoxic brain injury s/p PEA arrest. Echo 5/19 >  CTA chest 5/19 >  LE duplex 5/19 >   Micro Data:  5/18 COVID ([facility]) >> neg 5/18 MRSA PCR >> 5/18 BCx2 >> 5/18 trach asp >> 5/18 RVP >> neg  Antimicrobials:  5/18 vanc x1 5/18 zosyn x1 5/19 zosyn >   Interim history/subjective:  Sedation just turned off.  Has spontaneous respirations.  Objective   Blood pressure 115/64, pulse 71, temperature (!) 97.4 F (36.3 C), temperature source Axillary, resp. rate 20, height 6' (1.829 m), weight 104.5 kg, SpO2 97 %.    Vent Mode: PRVC FiO2 (%):  [50 %-60 %] 50 % Set Rate:  [18 bmp-20 bmp] 20 bmp Vt Set:  [620 mL] 620 mL PEEP:  [5 cmH20-8 cmH20] 8 cmH20 Plateau Pressure:  [18 cmH20-21 cmH20] 18 cmH20   Intake/Output Summary (Last 24 hours) at 05/16/2019 0750 Last data filed at 05/16/2019 0600 Gross per 24 hour  Intake 1517.72 ml  Output 2200 ml  Net -682.28 ml   Filed Weights   05/05/2019 1333 05/16/19 0500  Weight: 105.4 kg 104.5 kg   Examination: General: Adult male, well nourished HENT: [place] /  AT.  ETT in place Lungs: Respirations even and unlabored. CTAB Cardiovascular: RRR, no murmur Abdomen: obese, +bs, soft Extremities: w/d, no LE edema Neuro: Sedated.  Extends in upper extremities to pain.  Does have cough and gag as well as spontaneous respirations. GU: foley  Assessment & Plan:   PEA cardiac arrest - unclear etiology.  Likely precipitated due to respiratory failure.  Ddx cardiac vs sepsis vs obstructive (PE). P:  Continue supportive care. Echo pending. Goal MAP >65, currently normotensive. Continue empiric heparin for now. Assess CTA chest, LE duplex. Cardiology following.  Acute respiratory failure. Possible Multifocal PNA. R/o PE  given elevated ddimer (3101 ng/ml - converts to 3.1 per our scale at [facility]). Tobacco abuse - longstanding hx. P:  Start SBT trials - currently tolerating PSV at 10/5. Empiric zosyn. Follow cultures. Bronchial hygiene. Assess CTA chest, LE duplex. CXR intermittently.  Acute encephalopathy s/p cardiac arrest - CT head with concern for anoxia. P:  Hold sedation to allow for complete neuro evaluation. Might need neuro consult.  ETOH abuse - 10-12 beers on days he works, more on days off, not hx of reported withdrawal or seizures. P:  Daily thiamine, folate, MVI. Monitor for withdrawal.  Hyperglycemia P:  CBG q 4. SSI sensitive. Pending HgbA1c.  Best practice:  Diet: NPO, start TF if not extubated today. Pain/Anxiety/Delirium protocol (if indicated): propofol/ prn fentanyl.  RASS goal 0. VAP protocol (if indicated): yes DVT prophylaxis: Heparin. GI prophylaxis: PPI Glucose control: CBG q 4, SSI sensitive Mobility: BR Code Status: Full  Family Communication: Jermaine Everett, Jermaine Everett [phone]) spoke with by phone 5/18.  Will attempt to call this AM 5/19 as well.   Disposition: ICU   Critical care time: 40 mins    Eeshan Verbrugge Celine Mansesai, [place] Sidonie Dickens- [facility] [place] [facility] Pager: [phone].  If no answer, [phone] 05/16/2019, 8:12 AM

## 2019-05-16 NOTE — Progress Notes (Signed)
ANTICOAGULATION CONSULT NOTE - Initial Consult  Pharmacy Consult for heparin Indication: chest pain/ACS  No Known Allergies  Patient Measurements: Height: 6' (182.9 cm) Weight: 230 lb 6.1 oz (104.5 kg) IBW/kg (Calculated) : 77.6 Heparin Dosing Weight: 100kg  Vital Signs: Temp: 97.2 F (36.2 C) (05/19 0700) Temp Source: Oral (05/19 0338) BP: 115/64 (05/19 0700) Pulse Rate: 71 (05/19 0700)  Labs: Recent Labs    04/29/2019 1456 04/30/2019 1459  05/21/2019 1841 05/04/2019 2111 05/16/19 0245 05/16/19 0346  HGB  --   --    < > 14.6  --  14.0 13.9  HCT  --   --    < > 43.0  --  41.2 41.0  PLT  --   --   --   --   --  83*  --   CREATININE  --  1.28*  --   --   --  1.12  --   TROPONINI 1.50*  --   --   --  3.14* 2.91*  --    < > = values in this interval not displayed.    Estimated Creatinine Clearance: 91 mL/min (by C-G formula based on SCr of 1.12 mg/dL).   Medical History: Past Medical History:  Diagnosis Date  . ETOH abuse   . Tobacco abuse     Medications:  No medications prior to admission.   Scheduled:  . aspirin  81 mg Oral Daily  . chlorhexidine gluconate (MEDLINE KIT)  15 mL Mouth Rinse BID  . Chlorhexidine Gluconate Cloth  6 each Topical Q0600  . folic acid  1 mg Intravenous Daily  . insulin aspart  0-9 Units Subcutaneous Q4H  . ipratropium-albuterol  3 mL Nebulization Q6H  . mouth rinse  15 mL Mouth Rinse 10 times per day  . pantoprazole (PROTONIX) IV  40 mg Intravenous QHS  . thiamine  100 mg Intravenous Daily   Infusions:  . sodium chloride 75 mL/hr at 05/16/19 0600  . propofol (DIPRIVAN) infusion 50 mcg/kg/min (05/16/19 0600)    Assessment: 58yo male presents after PEA arrest, likely related to respiratory failure, troponin found to be elevated, CT head concerning for anoxic injury, to begin heparin for ACS.  Goal of Therapy:  Heparin level 0.3-0.7 units/ml Monitor platelets by anticoagulation protocol: Yes   Plan:  Rec'd SQ heparin ~2hr ago;  will begin heparin gtt at 1400 units/hr and monitor heparin levels and CBC.  Wynona Neat, PharmD, BCPS  05/16/2019,7:21 AM

## 2019-05-17 ENCOUNTER — Inpatient Hospital Stay (HOSPITAL_COMMUNITY): Payer: BLUE CROSS/BLUE SHIELD

## 2019-05-17 DIAGNOSIS — I82409 Acute embolism and thrombosis of unspecified deep veins of unspecified lower extremity: Secondary | ICD-10-CM

## 2019-05-17 DIAGNOSIS — I420 Dilated cardiomyopathy: Secondary | ICD-10-CM

## 2019-05-17 DIAGNOSIS — I519 Heart disease, unspecified: Secondary | ICD-10-CM

## 2019-05-17 LAB — CBC
HCT: 41.9 % (ref 39.0–52.0)
Hemoglobin: 14.1 g/dL (ref 13.0–17.0)
MCH: 34.3 pg — ABNORMAL HIGH (ref 26.0–34.0)
MCHC: 33.7 g/dL (ref 30.0–36.0)
MCV: 101.9 fL — ABNORMAL HIGH (ref 80.0–100.0)
Platelets: 84 10*3/uL — ABNORMAL LOW (ref 150–400)
RBC: 4.11 MIL/uL — ABNORMAL LOW (ref 4.22–5.81)
RDW: 13.3 % (ref 11.5–15.5)
WBC: 15.5 10*3/uL — ABNORMAL HIGH (ref 4.0–10.5)
nRBC: 0 % (ref 0.0–0.2)

## 2019-05-17 LAB — GLUCOSE, CAPILLARY
Glucose-Capillary: 117 mg/dL — ABNORMAL HIGH (ref 70–99)
Glucose-Capillary: 118 mg/dL — ABNORMAL HIGH (ref 70–99)
Glucose-Capillary: 118 mg/dL — ABNORMAL HIGH (ref 70–99)
Glucose-Capillary: 123 mg/dL — ABNORMAL HIGH (ref 70–99)
Glucose-Capillary: 127 mg/dL — ABNORMAL HIGH (ref 70–99)
Glucose-Capillary: 150 mg/dL — ABNORMAL HIGH (ref 70–99)

## 2019-05-17 LAB — TRIGLYCERIDES: Triglycerides: 311 mg/dL — ABNORMAL HIGH (ref <150)

## 2019-05-17 LAB — LEGIONELLA PNEUMOPHILA SEROGP 1 UR AG: L. pneumophila Serogp 1 Ur Ag: NEGATIVE

## 2019-05-17 LAB — PHOSPHORUS: Phosphorus: 3 mg/dL (ref 2.5–4.6)

## 2019-05-17 LAB — MAGNESIUM: Magnesium: 2.4 mg/dL (ref 1.7–2.4)

## 2019-05-17 MED ORDER — FUROSEMIDE 10 MG/ML IJ SOLN
40.0000 mg | Freq: Once | INTRAMUSCULAR | Status: AC
  Administered 2019-05-17: 40 mg via INTRAVENOUS
  Filled 2019-05-17: qty 4

## 2019-05-17 MED ORDER — VITAL HIGH PROTEIN PO LIQD
1000.0000 mL | ORAL | Status: DC
  Administered 2019-05-17 – 2019-05-18 (×2): 1000 mL

## 2019-05-17 MED ORDER — PRO-STAT SUGAR FREE PO LIQD
60.0000 mL | Freq: Two times a day (BID) | ORAL | Status: DC
  Administered 2019-05-17 – 2019-05-18 (×3): 60 mL via ORAL
  Filled 2019-05-17 (×3): qty 60

## 2019-05-17 NOTE — Progress Notes (Signed)
NAME:  Jermaine Everett, MRN:  [id number], DOB:  09-16-1961, LOS: 2 ADMISSION DATE:  04/27/2019, CONSULTATION DATE:  04/27/2019 [facility]ING [facility]:  [facility], CHIEF COMPLAINT:  Cardiac arrest  Brief History   58 year old male [facility] after being found with witness PEA arrest, ROSC after 1 round of epi.  Since remains intubated but not following commands.  COVID neg.  CXR with concerns for sepsis/  multifocal pneumonia.    History of present illness   HPI obtained from medical chart review and per conversation with patient's son, Jermaine Everett as patient is sedated on mechanical ventilation.   58 year old male with history of long standing tobacco and ETOH use who has not seen a medical provider in years per family.    Son states he complained of SOB at night about 2 weeks ago but since stated he felt better and seemed in his normal state of health last night.  No known reports of fever, chest pain, or shortness breath. Son reports he drinks 10-12 beers on a daily basis and more when off work.  Denies any illicit drugs.  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  Family report concern as there as been positive COVID cases.  Patient is divorced and has three sons, one of whom he lives with.   He was found on the side of the road in his car this morning by a co-worker, minimally responsive.  On EMS arrival, he went unresponsive and found to be in PEA arrest.  ROSC obtained after 1 round of CPR/ EPI. Taken to [facility] around 0710, and remained unresponsive and intubated.   Past Medical History  Tobacco abuse   Significant Hospital Events   5/18 Cardiac arrest/ tx to [facility]  Consults:  Cardiology  Procedures:  5/18 ETT >>  Significant Diagnostic Tests:  [facility] highly concerning for anoxic brain injury s/p PEA arrest. Echo 5/19 > low normal ejection fraction with mild ventricular dilatation.  Normal RV function. CTA chest 5/19 > negative for pulmonary embolism,  bibasilar atelectasis. LE duplex 5/19 > no evidence of deep venous thrombosis. CXR 5/20 > minimal bilateral airspace disease most consistent with volume overload. EEG 5/19 > no seizures.  Micro Data:  5/18 COVID ([facility]) >> neg 5/18 MRSA PCR >> 5/18 BCx2 >> 5/18 trach asp >> 5/18 RVP >> neg  Antimicrobials:  5/18 vanc x1 5/18 zosyn x1 5/19 zosyn > 5/20  Interim history/subjective:  Continues to have periods of ventilator dyssynchrony when stimulated.  Objective   Blood pressure 132/75, pulse 95, temperature 99.7 F (37.6 C), resp. rate 20, height 6' (1.829 m), weight 104.9 kg, SpO2 97 %.    Vent Mode: PRVC FiO2 (%):  [50 %-80 %] 50 % Set Rate:  [20 bmp] 20 bmp Vt Set:  [620 mL] 620 mL PEEP:  [5 cmH20-8 cmH20] 5 cmH20 Plateau Pressure:  [22 cmH20-25 cmH20] 25 cmH20   Intake/Output Summary (Last 24 hours) at 05/17/2019 1409 Last data filed at 05/17/2019 1300 Gross per 24 hour  Intake 1823.3 ml  Output 3420 ml  Net -1596.7 ml   Filed Weights   2019/10/25 1333 05/16/19 0500 05/17/19 0500  Weight: 105.4 kg 104.5 kg 104.9 kg   Examination: General: Adult male, obese. HENT: [place] / AT.  ETT in place, large neck. Lungs: Diffuse wheezing with coughing. Cardiovascular: RRR, no murmur Abdomen: obese, +bs, soft Extremities: w/d, no LE edema Neuro: Sedated.  Extends in upper extremities to  pain.  Does have cough and gag as well as spontaneous respirations. GU: foley  Assessment & Plan:   Critically ill due acute respiratory failure requiring mechanical ventilation.   Continues to wheeze and mental status precludes extubation. -Continue full ventilatory support until mental status improves.  PEA cardiac arrest - unclear etiology.  No evidence of acute coronary syndrome and no evidence of pulmonary embolism. Possibly due to alcohol intoxication in the context of likely undiagnosed COPD based on body habitus, or acute HF.  -Continue supportive care -No specific intervention  required.  Acute encephalopathy s/p cardiac arrest -  CT head with concern for anoxic injury, seemingly out of proportion with duration of rest.   Suggests prolonged hypoxia prior to PEA consistent with primary respiratory event. No evidence of seizure on EEG. -Hold sedation to allow for complete neuro evaluation.  Possible volume overload with diastolic dysfunction hypertension. Significant bronchospasm with wheezing, possible volume overload. -Attempt diuresis.  ETOH abuse - 10-12 beers on days he works, more on days off, not hx of reported withdrawal or seizures. -Daily thiamine, folate, MVI. - Monitor for withdrawal.  Best practice:  Diet: NPO, start TF if not extubated today. Pain/Anxiety/Delirium protocol (if indicated): propofol/ prn fentanyl.  RASS goal 0. VAP protocol (if indicated): yes DVT prophylaxis: Heparin. GI prophylaxis: PPI Glucose control: CBG q 4, SSI sensitive -adequate control on current regimen. Mobility: BR Code Status: Full  Family Communication: Jermaine Everett, Jermaine Everett ([phone]) spoke with by phone 5/18.  Disposition: ICU   CRITICAL CARE Performed by: Lynnell Catalan   Total critical care time: 40 minutes  Critical care time was exclusive of separately billable procedures and treating other patients.  Critical care was necessary to treat or prevent imminent or life-threatening deterioration.  Critical care was time spent personally by me on the following activities: development of treatment plan with patient and/or surrogate as well as nursing, discussions with consultants, evaluation of patient's response to treatment, examination of patient, obtaining history from patient or surrogate, ordering and performing treatments and interventions, ordering and review of laboratory studies, ordering and review of radiographic studies, pulse oximetry, re-evaluation of patient's condition and participation in multidisciplinary rounds.  Lynnell Catalan, MD [facility] ICU  Physician [facility]  Pager: [phone] Mobile: [phone] After hours: [phone].     05/17/2019, 2:09 PM

## 2019-05-17 NOTE — Progress Notes (Signed)
Bilateral lower extremity venous duplex has been completed. Preliminary results can be found in CV Proc through chart review.   05/17/19 8:41 AM Olen Cordial RVT

## 2019-05-17 NOTE — Progress Notes (Signed)
Progress Note  Patient Name: Jermaine Everett Date of Encounter: 05/17/2019  Primary Cardiologist: No primary care provider on file.   Subjective   Remains intubated, responsive only to pain. EEG with diffuse slowing (not my specialty but sound like compatible with anoxic injury). Echo shows a dilated LV with mildly depressed LVEF 45%, no significant valvular abnormalities. Had hematuria and heparin stopped.  Inpatient Medications    Scheduled Meds:  aspirin  81 mg Oral Daily   chlorhexidine gluconate (MEDLINE KIT)  15 mL Mouth Rinse BID   Chlorhexidine Gluconate Cloth  6 each Topical Q0600   feeding supplement (PRO-STAT SUGAR FREE 64)  60 mL Oral BID   folic acid  1 mg Intravenous Daily   insulin aspart  0-9 Units Subcutaneous Q4H   ipratropium-albuterol  3 mL Nebulization Q6H   mouth rinse  15 mL Mouth Rinse 10 times per day   pantoprazole (PROTONIX) IV  40 mg Intravenous QHS   thiamine  100 mg Intravenous Daily   Continuous Infusions:  sodium chloride Stopped (05/17/19 0800)   feeding supplement (VITAL HIGH PROTEIN) 1,000 mL (05/17/19 1000)   PRN Meds: sodium chloride, albuterol, fentaNYL (SUBLIMAZE) injection, fentaNYL (SUBLIMAZE) injection   Vital Signs    Vitals:   05/17/19 0736 05/17/19 0800 05/17/19 0900 05/17/19 1000  BP: (!) 149/84 93/78 137/73 (!) 171/90  Pulse: (!) 108 78 78 94  Resp: (!) _0 Temp:  98.8 F (37.1 C) 98.6 F (37 C) 98.8 F (37.1 C)  TempSrc:  Bladder    SpO2: 100% 99% 100% 100%  Weight:      Height:        Intake/Output Summary (Last 24 hours) at 05/17/2019 1005 Last data filed at 05/17/2019 1000 Gross per 24 hour  Intake 2079.3 ml  Output 1875 ml  Net 204.3 ml   Last 3 Weights 05/17/2019 05/16/2019 05/02/2019  Weight (lbs) 231 lb 4.2 oz 230 lb 6.1 oz 232 lb 5.8 oz  Weight (kg) 104.9 kg 104.5 kg 105.4 kg      Telemetry    NSR - Personally Reviewed  ECG    NSR incomplete RBBB- Personally  Reviewed  Physical Exam  Intubated, minimally responsive GEN: No acute distress.   Neck: No JVD Cardiac: RRR, no murmurs, rubs, or gallops.  Respiratory: Clear to auscultation bilaterally. GI: Soft, nontender, non-distended  MS: No edema; No deformity. Neuro:  n/a Psych: n/a  Labs    Chemistry Recent Labs  Lab 05/07/2019 1459  05/21/2019 1841 05/16/19 0245 05/16/19 0346  NA 136   < > 138 138 141  K 5.1   < > 4.9 4.1 4.2  CL 104  --   --  104  --   CO2 18*  --   --  23  --   GLUCOSE 188*  --   --  179*  --   BUN 14  --   --  14  --   CREATININE 1.28*  --   --  1.12  --   CALCIUM 7.8*  --   --  8.0*  --   ALBUMIN 3.2*  --   --   --   --   GFRNONAA >60  --   --  >60  --   GFRAA >60  --   --  >60  --   ANIONGAP 14  --   --  11  --    < > = values in this interval not  displayed.     Hematology Recent Labs  Lab 05/16/19 0245 05/16/19 0346 05/16/19 1507 05/17/19 0254  WBC 16.7*  --   --  15.5*  RBC 4.11*  --   --  4.11*  HGB 14.0 13.9 14.9 14.1  HCT 41.2 41.0 44.5 41.9  MCV 100.2*  --   --  101.9*  MCH 34.1*  --   --  34.3*  MCHC 34.0  --   --  33.7  RDW 12.7  --   --  13.3  PLT 83*  --   --  84*    Cardiac Enzymes Recent Labs  Lab 05/02/2019 1456 04/30/2019 2111 05/16/19 0245  TROPONINI 1.50* 3.14* 2.91*   No results for input(s): TROPIPOC in the last 168 hours.   BNPNo results for input(s): BNP, PROBNP in the last 168 hours.   DDimer No results for input(s): DDIMER in the last 168 hours.   Radiology    Ct Head Wo Contrast  Result Date: 05/14/2019 CLINICAL DATA:  Acute respiratory arrest.  Encephalopathy. EXAM: CT HEAD WITHOUT CONTRAST TECHNIQUE: Contiguous axial images were obtained from the base of the skull through the vertex without intravenous contrast. COMPARISON:  None. FINDINGS: Brain: There is scattered areas of decreased attenuation involving the bilateral frontal lobes. For example there is a 2.6 x 2.6 cm hypoattenuating area in the right frontal  lobe with associated loss of gray-white differentiation. There is hypoattenuation involving the left basal ganglia. There is no intracranial hemorrhage. No evidence of a midline shift. Vascular: No hyperdense vessel or unexpected calcification. Skull: Normal. Negative for fracture or focal lesion. Sinuses/Orbits: There is maxillary mucosal thickening bilaterally. There is thickening of the ethmoid air cells and frontal sinuses. The mastoid air cells are essentially clear. Other: None. IMPRESSION: Scattered hypoattenuating areas involving the bilateral frontal lobes and left basal ganglia with associated loss of gray-white differentiation is highly concerning for anoxic brain injury in the setting of PEA arrest. This can be further evaluated with MRI. These results were called by telephone at the time of interpretation on 05/03/2019 at 5:54 pm to RN Braddock , who verbally acknowledged these results. Electronically Signed   By: Constance Holster M.D.   On: 05/04/2019 17:55   Ct Angio Chest Pe W Or Wo Contrast  Result Date: 05/16/2019 CLINICAL DATA:  Cardiac arrest EXAM: CT ANGIOGRAPHY CHEST WITH CONTRAST TECHNIQUE: Multidetector CT imaging of the chest was performed using the standard protocol during bolus administration of intravenous contrast. Multiplanar CT image reconstructions and MIPs were obtained to evaluate the vascular anatomy. CONTRAST:  7m OMNIPAQUE IOHEXOL 350 MG/ML SOLN COMPARISON:  Same day chest radiograph FINDINGS: Cardiovascular: Satisfactory opacification of the pulmonary arteries to the segmental level. No evidence of pulmonary embolism. Cardiomegaly. Coronary artery calcifications. No pericardial effusion. Mediastinum/Nodes: No enlarged mediastinal, hilar, or axillary lymph nodes. Thyroid gland, trachea, and esophagus demonstrate no significant findings. Lungs/Pleura: Endotracheal tube is positioned in mid trachea. Small bilateral pleural effusions and associated atelectasis or  consolidation. Upper Abdomen: No acute abnormality. Musculoskeletal: No chest wall abnormality. No acute or significant osseous findings. Review of the MIP images confirms the above findings. IMPRESSION: 1.  Negative examination for pulmonary embolism. 2. Small bilateral pleural effusions and associated atelectasis or consolidation. 3.  Cardiomegaly and coronary artery disease. 4.  Endotracheal tube is positioned in the mid trachea. Electronically Signed   By: AEddie CandleM.D.   On: 05/16/2019 10:08   Dg Chest Port 1 View  Result Date: 05/17/2019 CLINICAL DATA:  Hypoxia EXAM: PORTABLE CHEST 1 VIEW COMPARISON:  Chest radiograph and chest CT May 16, 2019 FINDINGS: Endotracheal tube tip is 5.3 cm above the carina. Nasogastric tube tip and side port are below the diaphragm. No pneumothorax. There is airspace consolidation in the left lower lobe with left pleural effusion. There is a smaller right pleural effusion. Heart is mildly enlarged with pulmonary vascularity normal. No adenopathy. No bone lesions. IMPRESSION: Tube positions as described without pneumothorax. Left lower lobe consolidation. Bilateral pleural effusions, slightly larger on the left. Stable cardiac prominence. Electronically Signed   By: Lowella Grip III M.D.   On: 05/17/2019 07:54   Dg Chest Port 1 View  Result Date: 05/16/2019 CLINICAL DATA:  Respiratory failure EXAM: PORTABLE CHEST 1 VIEW COMPARISON:  Yesterday FINDINGS: Endotracheal tube tip between the clavicular heads and carina. The orogastric tube at least reaches the stomach. Stable cardiomegaly. Vascular congestion. Hazy opacity at the bases, likely atelectasis and pleural fluid. No pneumothorax. IMPRESSION: Stable hardware positioning. Cardiomegaly and vascular congestion. Symmetric haziness of the lower chest, favor atelectasis. Electronically Signed   By: Monte Fantasia M.D.   On: 05/16/2019 08:32   Dg Chest Port 1 View  Result Date: 05/23/2019 CLINICAL DATA:   Intubation. EXAM: PORTABLE CHEST 1 VIEW COMPARISON:  None. FINDINGS: The heart size and mediastinal contours are within normal limits. Endotracheal and nasogastric tubes are in grossly good position. No pneumothorax is noted. Mild bibasilar subsegmental atelectasis is noted with minimal left pleural effusion. The visualized skeletal structures are unremarkable. IMPRESSION: Endotracheal and nasogastric tubes are in grossly good position. Mild bibasilar subsegmental atelectasis is noted with minimal left pleural effusion. Electronically Signed   By: Marijo Conception M.D.   On: 04/29/2019 16:26   Vas [place] Lower Extremity Venous (dvt)  Result Date: 05/17/2019  Lower Venous Study Indications: Cardiac arrest.  Performing Technologist: Oliver Hum RVT  Examination Guidelines: A complete evaluation includes B-mode imaging, spectral Doppler, color Doppler, and power Doppler as needed of all accessible portions of each vessel. Bilateral testing is considered an integral part of a complete examination. Limited examinations for reoccurring indications may be performed as noted.  +---------+---------------+---------+-----------+----------+-------+  RIGHT     Compressibility Phasicity Spontaneity Properties Summary  +---------+---------------+---------+-----------+----------+-------+  CFV       Full            Yes       Yes                             +---------+---------------+---------+-----------+----------+-------+  SFJ       Full                                                      +---------+---------------+---------+-----------+----------+-------+  FV Prox   Full                                                      +---------+---------------+---------+-----------+----------+-------+  FV Mid    Full                                                      +---------+---------------+---------+-----------+----------+-------+  FV Distal Full                                                       +---------+---------------+---------+-----------+----------+-------+  PFV       Full                                                      +---------+---------------+---------+-----------+----------+-------+  POP       Full            Yes       Yes                             +---------+---------------+---------+-----------+----------+-------+  PTV       Full                                                      +---------+---------------+---------+-----------+----------+-------+  PERO      Full                                                      +---------+---------------+---------+-----------+----------+-------+   +---------+---------------+---------+-----------+----------+-------+  LEFT      Compressibility Phasicity Spontaneity Properties Summary  +---------+---------------+---------+-----------+----------+-------+  CFV       Full            Yes       Yes                             +---------+---------------+---------+-----------+----------+-------+  SFJ       Full                                                      +---------+---------------+---------+-----------+----------+-------+  FV Prox   Full                                                      +---------+---------------+---------+-----------+----------+-------+  FV Mid    Full                                                      +---------+---------------+---------+-----------+----------+-------+  FV Distal Full                                                      +---------+---------------+---------+-----------+----------+-------+  PFV       Full                                                      +---------+---------------+---------+-----------+----------+-------+  POP       Full            Yes       Yes                             +---------+---------------+---------+-----------+----------+-------+  PTV       Full                                                      +---------+---------------+---------+-----------+----------+-------+  PERO      Full                                                       +---------+---------------+---------+-----------+----------+-------+     Summary: Right: There is no evidence of deep vein thrombosis in the lower extremity. No cystic structure found in the popliteal fossa. Left: There is no evidence of deep vein thrombosis in the lower extremity. No cystic structure found in the popliteal fossa.  *See table(s) above for measurements and observations.    Preliminary     Cardiac Studies   2D Echo 05/16/19 IMPRESSIONS    1. The left ventricle has mildly reduced systolic function, with an ejection fraction of 45-50%. The cavity size was moderately dilated. Left ventricular diastolic function could not be evaluated due to nondiagnostic images. Elevated left ventricular  end-diastolic pressure Left ventricular diffuse hypokinesis.  2. The right ventricle has normal systolic function. The cavity was normal. There is no increase in right ventricular wall thickness. Right ventricular systolic pressure could not be assessed.  3. The interatrial septum appears to be lipomatous.  Patient Profile     58 y.o.malewith a hx of EtOH abuse and tobacco abusewho is being followed by cardiology for the evaluation of cardiac arrest.  Assessment & Plan    1. PEA Arrest: ROSC achieved after 1 round of epi. Intubated in field. COVID 19 negative. Chest CT negative for PE Troponins peaked at 3.14, and showed dowward trend yesterday down to 2.91. Echocardiogram shows mildly reduced LVEF at 45-50% with diffuse LV hypokinesis. Unless there is some sign of neurological improvement, we will not be performing additional cardiac evaluation.  2. 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 Doppler suggests that he is mildly fluid overloaded, but diuresis is not imperative as long as he is oxygenating well. Would need diuresis if there is a plan to extubate.  3. Anoxic encephalopathy:  Prognosis appears  to be poor.  4. Elevated troponin is most consistent with demand injury of myocardium. Doubt true acute atherothrombotic coronary event. Would not restart IV heparin, just DVT prophylaxis once hematuria improves.  At this point, we have little to offer. Please reconsult as needed.  [facility] will sign off.  Medication Recommendations:  ASA 81 mg daily, low-dose prophylactic SQ heparin/enoxaparin Other recommendations (labs, testing, etc):  n/a Follow up as an outpatient:  n/a    For questions or updates, please contact [place] Please consult [email or web address] for contact info under        Signed, Lyda Jester, PA-C  05/17/2019, 10:05 AM

## 2019-05-18 DIAGNOSIS — I469 Cardiac arrest, cause unspecified: Secondary | ICD-10-CM

## 2019-05-18 LAB — GLUCOSE, CAPILLARY
Glucose-Capillary: 161 mg/dL — ABNORMAL HIGH (ref 70–99)
Glucose-Capillary: 109 mg/dL — ABNORMAL HIGH (ref 70–99)
Glucose-Capillary: 190 mg/dL — ABNORMAL HIGH (ref 70–99)

## 2019-05-18 LAB — COMPREHENSIVE METABOLIC PANEL
ALT: 35 U/L (ref 0–44)
AST: 37 U/L (ref 15–41)
Albumin: 2.9 g/dL — ABNORMAL LOW (ref 3.5–5.0)
Alkaline Phosphatase: 61 U/L (ref 38–126)
Anion gap: 7 (ref 5–15)
BUN: 26 mg/dL — ABNORMAL HIGH (ref 6–20)
CO2: 26 mmol/L (ref 22–32)
Calcium: 8.3 mg/dL — ABNORMAL LOW (ref 8.9–10.3)
Chloride: 109 mmol/L (ref 98–111)
Creatinine, Ser: 0.95 mg/dL (ref 0.61–1.24)
GFR calc Af Amer: >60 mL/min (ref >60)
GFR calc non Af Amer: >60 mL/min (ref >60)
Glucose, Bld: 133 mg/dL — ABNORMAL HIGH (ref 70–99)
Potassium: 3.5 mmol/L (ref 3.5–5.1)
Sodium: 142 mmol/L (ref 135–145)
Total Bilirubin: 1.2 mg/dL (ref 0.3–1.2)
Total Protein: 5.8 g/dL — ABNORMAL LOW (ref 6.5–8.1)

## 2019-05-18 LAB — CULTURE, RESPIRATORY W GRAM STAIN: Culture: NORMAL

## 2019-05-18 LAB — AMMONIA: Ammonia: 44 umol/L — ABNORMAL HIGH (ref 9–35)

## 2019-05-18 LAB — MAGNESIUM: Magnesium: 2.2 mg/dL (ref 1.7–2.4)

## 2019-05-18 MED ORDER — GLYCOPYRROLATE 0.2 MG/ML IJ SOLN
0.1000 mg | Freq: Once | INTRAMUSCULAR | Status: AC
  Administered 2019-05-18: 0.1 mg via INTRAVENOUS

## 2019-05-18 MED ORDER — LORAZEPAM 2 MG/ML IJ SOLN
2.0000 mg | INTRAMUSCULAR | Status: DC | PRN
  Administered 2019-05-18 (×2): 2 mg via INTRAVENOUS
  Filled 2019-05-18 (×2): qty 1

## 2019-05-18 MED ORDER — MORPHINE 100MG IN NS 100ML (1MG/ML) PREMIX INFUSION
0.0000 mg/h | INTRAVENOUS | Status: DC
  Administered 2019-05-18: 20:00:00 5 mg/h via INTRAVENOUS
  Filled 2019-05-18 (×2): qty 100

## 2019-05-18 MED ORDER — GLYCOPYRROLATE 1 MG PO TABS: 1.0000 mg | ORAL_TABLET | ORAL | Status: DC | PRN

## 2019-05-18 MED ORDER — FENTANYL 2500MCG IN NS 250ML (10MCG/ML) PREMIX INFUSION
0.0000 ug/h | INTRAVENOUS | Status: DC
  Administered 2019-05-18: 14:00:00 100 ug/h via INTRAVENOUS
  Filled 2019-05-18 (×2): qty 250

## 2019-05-18 MED ORDER — GLYCOPYRROLATE 0.2 MG/ML IJ SOLN: 0.2000 mg | INTRAMUSCULAR | Status: DC | PRN

## 2019-05-18 MED ORDER — DEXTROSE 5 % IV SOLN: INTRAVENOUS | Status: DC

## 2019-05-18 MED ORDER — FUROSEMIDE 10 MG/ML IJ SOLN
40.0000 mg | Freq: Once | INTRAMUSCULAR | Status: AC
  Administered 2019-05-18: 40 mg via INTRAVENOUS
  Filled 2019-05-18: qty 4

## 2019-05-18 MED ORDER — GLYCOPYRROLATE 0.2 MG/ML IJ SOLN
0.2000 mg | INTRAMUSCULAR | Status: DC | PRN
  Administered 2019-05-18: 0.2 mg via INTRAVENOUS
  Filled 2019-05-18: qty 1

## 2019-05-18 MED ORDER — POTASSIUM CHLORIDE 20 MEQ/15ML (10%) PO SOLN
20.0000 meq | ORAL | Status: AC
  Administered 2019-05-18 (×2): 20 meq
  Filled 2019-05-18 (×2): qty 15

## 2019-05-18 MED ORDER — ACETAMINOPHEN 650 MG RE SUPP: 650.0000 mg | Freq: Four times a day (QID) | RECTAL | Status: DC | PRN

## 2019-05-18 MED ORDER — ACETAMINOPHEN 325 MG PO TABS: 650.0000 mg | ORAL_TABLET | Freq: Four times a day (QID) | ORAL | Status: DC | PRN

## 2019-05-18 MED ORDER — DIPHENHYDRAMINE HCL 50 MG/ML IJ SOLN: 25.0000 mg | INTRAMUSCULAR | Status: DC | PRN

## 2019-05-18 MED ORDER — POLYVINYL ALCOHOL 1.4 % OP SOLN
1.0000 [drp] | Freq: Four times a day (QID) | OPHTHALMIC | Status: DC | PRN
  Filled 2019-05-18: qty 15

## 2019-05-18 MED ORDER — MORPHINE BOLUS VIA INFUSION
5.0000 mg | INTRAVENOUS | Status: DC | PRN
  Administered 2019-05-18 (×8): 5 mg via INTRAVENOUS
  Filled 2019-05-18: qty 5

## 2019-05-18 MED ORDER — MORPHINE SULFATE (PF) 2 MG/ML IV SOLN: 2.0000 mg | INTRAVENOUS | Status: DC | PRN

## 2019-05-18 MED ORDER — MIDAZOLAM HCL 2 MG/2ML IJ SOLN: 2.0000 mg | INTRAMUSCULAR | Status: DC | PRN

## 2019-05-18 NOTE — Progress Notes (Signed)
Spoke with Gennaro Africa who has communicated with patients other two sons. They have decided to proceed with comfort measures. Will plan for terminal extubation once they arrive this afternoon.

## 2019-05-18 NOTE — Progress Notes (Signed)
Patient cease to breath no lung sounds auscultated no b/p HR 0  family present @ bedside. Support given to family.

## 2019-05-18 NOTE — Procedures (Signed)
Extubation Procedure Note  Patient Details:   Name: Jermaine Everett DOB: 01/09/1961 MRN: [id number]     Evaluation  O2 sats: stable throughout Complications: No apparent complications Patient did not tolerate procedure well. Bilateral Breath Sounds: Rhonchi   No Pt terminally extubated per MD order.     Berton Bon 05/01/2019, 8:34 PM

## 2019-05-18 NOTE — Progress Notes (Signed)
NAME:  Jermaine Everett, MRN:  [id number], DOB:  Nov 14, 1961, LOS: 3 ADMISSION DATE:  05/16/2019, CONSULTATION DATE:  05/20/2019 REFERRING MD:  [facility], CHIEF COMPLAINT:  Cardiac arrest  Brief History   58 year old male transferred from [facility] ER after being found with witness PEA arrest, ROSC after 1 round of epi.  Since remains intubated but not following commands.  COVID neg.  CXR with concerns for sepsis/  multifocal pneumonia.    History of present illness   HPI obtained from medical chart review and per conversation with patient's son, Jermaine Everett as patient is sedated on mechanical ventilation.   58 year old male with history of long standing tobacco and ETOH use who has not seen a medical provider in years per family.    Son states he complained of SOB at night about 2 weeks ago but since stated he felt better and seemed in his normal state of health last night.  No known reports of fever, chest pain, or shortness breath. Son reports he drinks 10-12 beers on a daily basis and more when off work.  Denies any illicit drugs.  He works in a factory, Biomedical scientist, Scientist, research (medical).  Family report concern as there as been positive COVID cases.  Patient is divorced and has three sons, one of whom he lives with.   He was found on the side of the road in his car this morning by a co-worker, minimally responsive.  On EMS arrival, he went unresponsive and found to be in PEA arrest.  ROSC obtained after 1 round of CPR/ EPI. Taken to [facility] around 0710, and remained unresponsive and intubated.   Past Medical History  Tobacco abuse   Significant Hospital Events   5/18 Cardiac arrest/ tx to [facility]  Consults:  Cardiology  Procedures:  5/18 ETT >>  Significant Diagnostic Tests:  5/18 CTH > highly concerning for anoxic brain injury s/p PEA arrest. Echo 5/19 > low normal ejection fraction with mild ventricular dilatation.  Normal RV function. CTA chest 5/19 > negative for pulmonary embolism,  bibasilar atelectasis. LE duplex 5/19 > no evidence of deep venous thrombosis. CXR 5/20 > minimal bilateral airspace disease most consistent with volume overload. EEG 5/19 > no seizures.  Micro Data:  5/18 COVID ([place]) > neg 5/18 MRSA PCR > negative  5/18 BCx2 > no growth to date >> 5/18 trach asp >> 5/18 RVP > neg 5/18 Urine > Negative   Antimicrobials:  5/18 vanc x1 5/18 zosyn x1 5/19 zosyn > 5/20  Interim history/subjective:  No events overnight. Remains on ventilator.   Objective   Blood pressure (!) 179/77, pulse (!) 111, temperature 100 F (37.8 C), resp. rate (!) 22, height 6' (1.829 m), weight 102.6 kg, SpO2 95 %.    Vent Mode: PRVC FiO2 (%):  [40 %-50 %] 40 % Set Rate:  [20 bmp] 20 bmp Vt Set:  [620 mL] 620 mL PEEP:  [5 cmH20] 5 cmH20 Plateau Pressure:  [16 cmH20-22 cmH20] 16 cmH20   Intake/Output Summary (Last 24 hours) at 05/28/19 0925 Last data filed at 05-28-19 0800 Gross per 24 hour  Intake 1072.6 ml  Output 3655 ml  Net -2582.4 ml   Filed Weights   05/16/19 0500 05/17/19 0500 05/28/2019 0444  Weight: 104.5 kg 104.9 kg 102.6 kg   Examination: General: Adult male, obese, no vent  HENT: [place] / AT. ETT in place, large neck. Lungs: Diminished coarse breath sounds, no wheeze/crackles  Cardiovascular: RRR, no murmur  Abdomen: obese, +bs, soft Extremities: -edema  Neuro: Off sedation, pupils intact, +cough/gag, does not follow commands, postures when stimulated  GU: foley in place   Assessment & Plan:   Critically ill due acute respiratory failure requiring mechanical ventilation. Mentation Precludes extubation  Plan  -Vent Support -Trend ABG/CXR -Pulmonary Hygiene   PEA cardiac arrest - unclear etiology. CTA negative for PEA    Possibly due to alcohol intoxication in the context of likely undiagnosed COPD based on body habitus, or acute HF. ECHO with dilated LV with EF 45% Volume overload with diastolic dysfunction hypertension. Plan   -Cardiology Consulted and signed off 5/20  -Cardiac Monitoring  -Diuresis as tolerated, Give lasix 40 mg x 1 now   Acute encephalopathy s/p cardiac arrest with presumed Anoxic Injury  Plan CT head with concern for anoxic injury, seemingly out of proportion with duration of rest.   Suggests prolonged hypoxia prior to PEA consistent with primary respiratory event. No evidence of seizure on EEG. Plan -Continued to hold sedative medications as able  -PRN Fentanyl/Versed   ETOH abuse  10-12 beers on days he works, more on days off, not hx of reported withdrawal or seizures. Plan - Daily thiamine, folate, MVI. - Monitor for withdrawal.  Best practice:  Diet: TF Pain/Anxiety/Delirium protocol VAP protocol (if indicated): yes DVT prophylaxis: Heparin. GI prophylaxis: PPI Glucose control: CBG q 4, SSI sensitive -adequate control on current regimen. Mobility: BR Code Status: Full  Family Communication: Jermaine Everett, Jermaine Everett ([phone]) spoke with by phone 5/21.  Disposition: ICU   CC Time: 45 minutes   Jovita KussmaulKatalina Trachelle Low, AGACNP-BC [facility]  Pgr: [phone]  PCCM Pgr: [phone]

## 2019-05-18 NOTE — Progress Notes (Signed)
[facility] ADULT ICU REPLACEMENT PROTOCOL FOR AM LAB REPLACEMENT ONLY  The patient does apply for the [facility] Adult ICU Electrolyte Replacment Protocol based on the criteria listed below:   1. Is GFR >/= 40 ml/min? Yes.    Patient's GFR today is >60 2. Is urine output >/= 0.5 ml/kg/hr for the last 6 hours? Yes.   Patient's UOP is .5 ml/kg/hr 3. Is BUN < 60 mg/dL? Yes.    Patient's BUN today is 26 4. Abnormal electrolyte(s): K-3.5 5. Ordered repletion with: per protocol 6. If a panic level lab has been reported, has the CCM MD in charge been notified? Yes.  .   Physician:  Dr. Janne Lab, Dixon Boos 05/13/2019 6:03 AM

## 2019-05-18 NOTE — Progress Notes (Signed)
eLink Physician-Brief Progress Note Patient Name: EFFORD DENIO DOB: [birth year] MRN: [id number]   Date of Service  05/07/2019  HPI/Events of Note  Notified of patient's passing. Pt was extubated terminally as planned.  eICU Interventions       Intervention Category Minor Interventions: Other:  Larinda Buttery 05/02/2019, 11:35 PM

## 2019-05-18 NOTE — Progress Notes (Signed)
Patient Extubated to Room Air per MD order. Morphine  Drip started family present @ bedside.

## 2019-05-20 LAB — CULTURE, BLOOD (ROUTINE X 2)
Culture: NO GROWTH
Culture: NO GROWTH
Special Requests: ADEQUATE
Special Requests: ADEQUATE

## 2019-05-24 ENCOUNTER — Telehealth: Payer: Self-pay

## 2019-05-24 NOTE — Telephone Encounter (Signed)
On 05/24/2019 Received dc from [facility].  DC is for burial and a patient of Doctor Agarwala.  DC will be taken to [facility] for signature.  On 05/25/2019 Received the dc back from Doctor Molli Knock who signed the dc for Doctor Agarwala.. I called the funeral home to let them know dc was mailed to vital records per the funeral home request.

## 2019-05-29 NOTE — Progress Notes (Signed)
Patient family requested patient be picked up in the room and that patient not go down to the morgue. Spoke with Olegario Messier from patient placement and due to the patient being a possible eye and tissue donor, Jermaine Everett donor must release them before funeral home can pick the patient up. [place] donor called at 1:40 to check the status. Was informed the usually wait a little bit to contact the family. I informed them we were waiting on them and they stated they would put a note in the chart to try to speed the process up.

## 2019-05-29 NOTE — Progress Notes (Signed)
Wasted  130 ml of Fentanyl in the sink witnessed by [employer]. Wasted 35 ml of Morphine in the sink witnessed by [employer].

## 2019-05-29 NOTE — Death Summary Note (Signed)
DEATH SUMMARY   Patient Details  Name: Jermaine Everett MRN: [id number] DOB: 12-29-1960  Admission/Discharge Information   Admit Date:  06-12-19  Date of Death: Date of Death: 2019/06/15  Time of Death: Time of Death: 05-11-58  Length of Stay: 4  Referring Physician: System, Pcp Not In   Reason(s) for Hospitalization  Cardiac arrest  Diagnoses  Preliminary cause of death: anoxic brain injury Secondary Diagnoses (including complications and co-morbidities):  Active Problems:   Acute hypoxemic respiratory failure [facility])   Brief Hospital Course (including significant findings, care, treatment, and services provided and events leading to death)  JOSAIH ZURCHER is a 58 y.o. year old male who was found unresponsive in car on his way to work. PEA arrest.  On arrival CT showed early anoxic injury.  Despite hemodynamic stabilization, the patient remained persistently encephalopathic with no more than extensor posturing at 48h.  Strong possibility of poor neurological recovery was discussed with son.  On further discussion with family members they opted for comfort care and patient was compassionately extubated on their arrival.  Pertinent Labs and Studies  Significant Diagnostic Studies Ct Head Wo Contrast  Result Date: 2019/06/12 CLINICAL DATA:  Acute respiratory arrest.  Encephalopathy. EXAM: CT HEAD WITHOUT CONTRAST TECHNIQUE: Contiguous axial images were obtained from the base of the skull through the vertex without intravenous contrast. COMPARISON:  None. FINDINGS: Brain: There is scattered areas of decreased attenuation involving the bilateral frontal lobes. For example there is a 2.6 x 2.6 cm hypoattenuating area in the right frontal lobe with associated loss of gray-white differentiation. There is hypoattenuation involving the left basal ganglia. There is no intracranial hemorrhage. No evidence of a midline shift. Vascular: No hyperdense vessel or unexpected calcification. Skull:  Normal. Negative for fracture or focal lesion. Sinuses/Orbits: There is maxillary mucosal thickening bilaterally. There is thickening of the ethmoid air cells and frontal sinuses. The mastoid air cells are essentially clear. Other: None. IMPRESSION: Scattered hypoattenuating areas involving the bilateral frontal lobes and left basal ganglia with associated loss of gray-white differentiation is highly concerning for anoxic brain injury in the setting of PEA arrest. This can be further evaluated with MRI. These results were called by telephone at the time of interpretation on 12-Jun-2019 at 5:54 pm to RN Braddock , who verbally acknowledged these results. Electronically Signed   By: Katherine Mantle M.D.   On: 06-12-19 17:55   Ct Angio Chest Pe W Or Wo Contrast  Result Date: 05/16/2019 CLINICAL DATA:  Cardiac arrest EXAM: CT ANGIOGRAPHY CHEST WITH CONTRAST TECHNIQUE: Multidetector CT imaging of the chest was performed using the standard protocol during bolus administration of intravenous contrast. Multiplanar CT image reconstructions and MIPs were obtained to evaluate the vascular anatomy. CONTRAST:  36mL OMNIPAQUE IOHEXOL 350 MG/ML SOLN COMPARISON:  Same day chest radiograph FINDINGS: Cardiovascular: Satisfactory opacification of the pulmonary arteries to the segmental level. No evidence of pulmonary embolism. Cardiomegaly. Coronary artery calcifications. No pericardial effusion. Mediastinum/Nodes: No enlarged mediastinal, hilar, or axillary lymph nodes. Thyroid gland, trachea, and esophagus demonstrate no significant findings. Lungs/Pleura: Endotracheal tube is positioned in mid trachea. Small bilateral pleural effusions and associated atelectasis or consolidation. Upper Abdomen: No acute abnormality. Musculoskeletal: No chest wall abnormality. No acute or significant osseous findings. Review of the MIP images confirms the above findings. IMPRESSION: 1.  Negative examination for pulmonary embolism. 2. Small  bilateral pleural effusions and associated atelectasis or consolidation. 3.  Cardiomegaly and coronary artery disease. 4.  Endotracheal tube is positioned in the  mid trachea. Electronically Signed   By: Lauralyn Primes M.D.   On: 05/16/2019 10:08   Dg Chest Port 1 View  Result Date: 05/17/2019 CLINICAL DATA:  Hypoxia EXAM: PORTABLE CHEST 1 VIEW COMPARISON:  Chest radiograph and chest CT May 16, 2019 FINDINGS: Endotracheal tube tip is 5.3 cm above the carina. Nasogastric tube tip and side port are below the diaphragm. No pneumothorax. There is airspace consolidation in the left lower lobe with left pleural effusion. There is a smaller right pleural effusion. Heart is mildly enlarged with pulmonary vascularity normal. No adenopathy. No bone lesions. IMPRESSION: Tube positions as described without pneumothorax. Left lower lobe consolidation. Bilateral pleural effusions, slightly larger on the left. Stable cardiac prominence. Electronically Signed   By: Bretta Bang III M.D.   On: 05/17/2019 07:54   Dg Chest Port 1 View  Result Date: 05/16/2019 CLINICAL DATA:  Respiratory failure EXAM: PORTABLE CHEST 1 VIEW COMPARISON:  Yesterday FINDINGS: Endotracheal tube tip between the clavicular heads and carina. The orogastric tube at least reaches the stomach. Stable cardiomegaly. Vascular congestion. Hazy opacity at the bases, likely atelectasis and pleural fluid. No pneumothorax. IMPRESSION: Stable hardware positioning. Cardiomegaly and vascular congestion. Symmetric haziness of the lower chest, favor atelectasis. Electronically Signed   By: Marnee Spring M.D.   On: 05/16/2019 08:32   Dg Chest Port 1 View  Result Date: 05/26/2019 CLINICAL DATA:  Intubation. EXAM: PORTABLE CHEST 1 VIEW COMPARISON:  None. FINDINGS: The heart size and mediastinal contours are within normal limits. Endotracheal and nasogastric tubes are in grossly good position. No pneumothorax is noted. Mild bibasilar subsegmental atelectasis is  noted with minimal left pleural effusion. The visualized skeletal structures are unremarkable. IMPRESSION: Endotracheal and nasogastric tubes are in grossly good position. Mild bibasilar subsegmental atelectasis is noted with minimal left pleural effusion. Electronically Signed   By: Lupita Raider M.D.   On: 05/17/2019 16:26   Vas [place] Lower Extremity Venous (dvt)  Result Date: 05/17/2019  Lower Venous Study Indications: Cardiac arrest.  Performing Technologist: Chanda Busing RVT  Examination Guidelines: A complete evaluation includes B-mode imaging, spectral Doppler, color Doppler, and power Doppler as needed of all accessible portions of each vessel. Bilateral testing is considered an integral part of a complete examination. Limited examinations for reoccurring indications may be performed as noted.  +---------+---------------+---------+-----------+----------+-------+ RIGHT    CompressibilityPhasicitySpontaneityPropertiesSummary +---------+---------------+---------+-----------+----------+-------+ CFV      Full           Yes      Yes                          +---------+---------------+---------+-----------+----------+-------+ SFJ      Full                                                 +---------+---------------+---------+-----------+----------+-------+ FV Prox  Full                                                 +---------+---------------+---------+-----------+----------+-------+ FV Mid   Full                                                 +---------+---------------+---------+-----------+----------+-------+  FV DistalFull                                                 +---------+---------------+---------+-----------+----------+-------+ PFV      Full                                                 +---------+---------------+---------+-----------+----------+-------+ POP      Full           Yes      Yes                           +---------+---------------+---------+-----------+----------+-------+ PTV      Full                                                 +---------+---------------+---------+-----------+----------+-------+ PERO     Full                                                 +---------+---------------+---------+-----------+----------+-------+   +---------+---------------+---------+-----------+----------+-------+ LEFT     CompressibilityPhasicitySpontaneityPropertiesSummary +---------+---------------+---------+-----------+----------+-------+ CFV      Full           Yes      Yes                          +---------+---------------+---------+-----------+----------+-------+ SFJ      Full                                                 +---------+---------------+---------+-----------+----------+-------+ FV Prox  Full                                                 +---------+---------------+---------+-----------+----------+-------+ FV Mid   Full                                                 +---------+---------------+---------+-----------+----------+-------+ FV DistalFull                                                 +---------+---------------+---------+-----------+----------+-------+ PFV      Full                                                 +---------+---------------+---------+-----------+----------+-------+ POP  Full           Yes      Yes                          +---------+---------------+---------+-----------+----------+-------+ PTV      Full                                                 +---------+---------------+---------+-----------+----------+-------+ PERO     Full                                                 +---------+---------------+---------+-----------+----------+-------+     Summary: Right: There is no evidence of deep vein thrombosis in the lower extremity. No cystic structure found in the popliteal fossa. Left: There is  no evidence of deep vein thrombosis in the lower extremity. No cystic structure found in the popliteal fossa.  *See table(s) above for measurements and observations. Electronically signed by Gretta Beganodd Early MD on 05/17/2019 at 4:34:28 PM.    Final     Microbiology No results found for this or any previous visit (from the past 240 hour(s)).  Lab Basic Metabolic Panel: No results for input(s): NA, K, CL, CO2, GLUCOSE, BUN, CREATININE, CALCIUM, MG, PHOS in the last 168 hours. Liver Function Tests: No results for input(s): AST, ALT, ALKPHOS, BILITOT, PROT, ALBUMIN in the last 168 hours. No results for input(s): LIPASE, AMYLASE in the last 168 hours. No results for input(s): AMMONIA in the last 168 hours. CBC: No results for input(s): WBC, NEUTROABS, HGB, HCT, MCV, PLT in the last 168 hours. Cardiac Enzymes: No results for input(s): CKTOTAL, CKMB, CKMBINDEX, TROPONINI in the last 168 hours. Sepsis Labs: No results for input(s): PROCALCITON, WBC, LATICACIDVEN in the last 168 hours.  Procedures/Operations  Mechanical ventilation.   Patty Lopezgarcia 05/28/2019, 10:36 AM

## 2019-05-29 DEATH — deceased

## 2020-11-01 IMAGING — DX PORTABLE CHEST - 1 VIEW
1 series · 1 of 1 positions shown · non-contrast
Comparison: Yesterday

CLINICAL DATA: Respiratory failure

EXAM:
PORTABLE CHEST 1 VIEW

[chest]
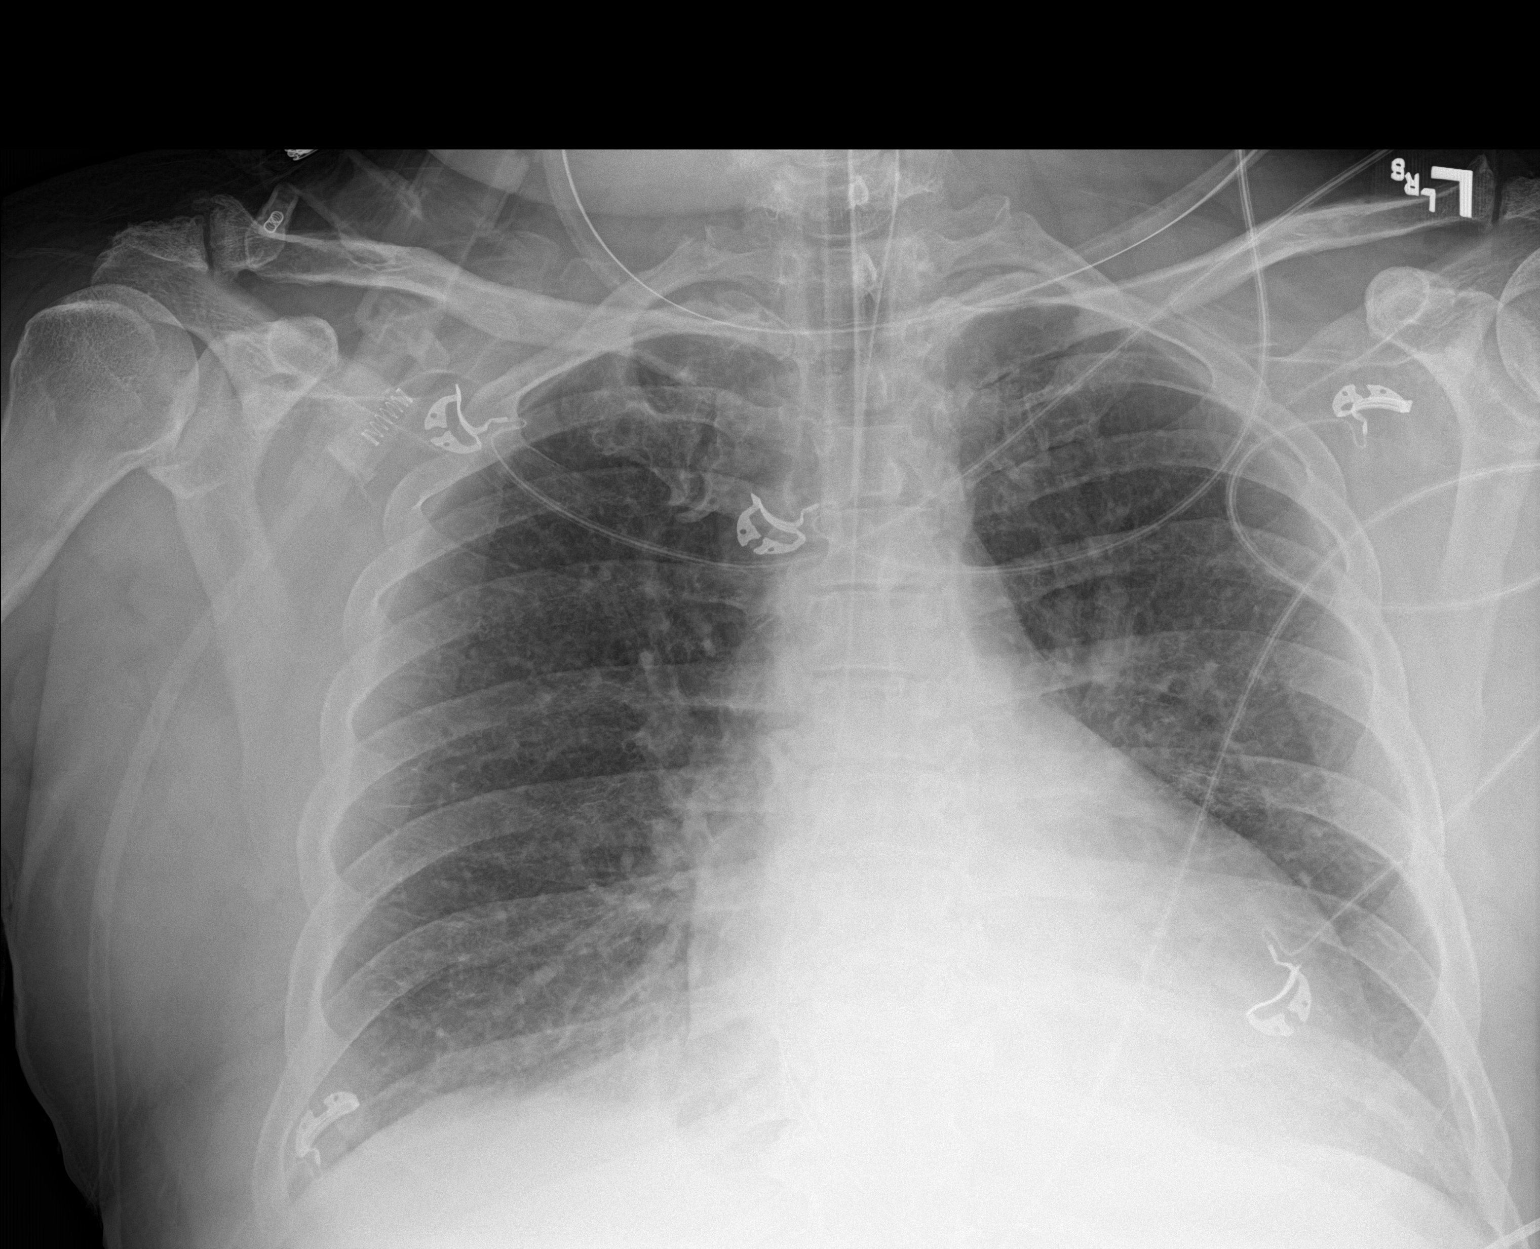

[1 of 1 positions shown; findings below may reference images not displayed]

FINDINGS: Endotracheal tube tip between the clavicular heads and carina. The
orogastric tube at least reaches the stomach.

Stable cardiomegaly. Vascular congestion. Hazy opacity at the bases,
likely atelectasis and pleural fluid. No pneumothorax.
IMPRESSION: Stable hardware positioning.

Cardiomegaly and vascular congestion.

Symmetric haziness of the lower chest, favor atelectasis.

## 2020-11-01 IMAGING — CT CT ANGIOGRAPHY CHEST
1 of 6 series · 4 of 16 positions shown · IV contrast (omnipaque)
Comparison: Same day chest radiograph

CLINICAL DATA: Cardiac arrest

EXAM:
CT ANGIOGRAPHY CHEST WITH CONTRAST
TECHNIQUE: Multidetector CT imaging of the chest was performed using the
standard protocol during bolus administration of intravenous
contrast. Multiplanar CT image reconstructions and MIPs were
obtained to evaluate the vascular anatomy.
CONTRAST:  80mL OMNIPAQUE IOHEXOL 350 MG/ML SOLN

[Series 7: pe thins · axial · 0.72mm/px · z∈[+1254,+1455]mm · 4 of 479 slices shown]
[im 96/479  lung]
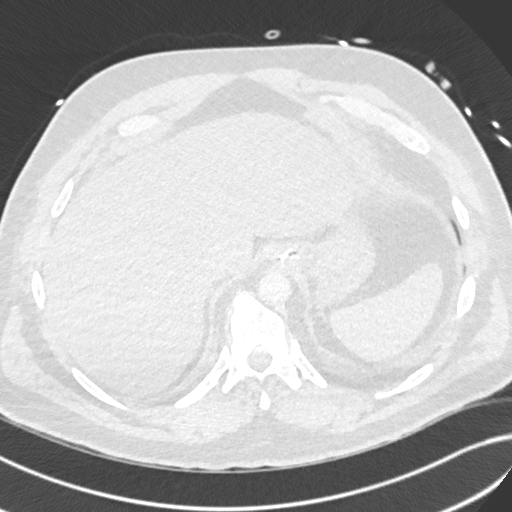
[im 192/479  soft-tissue]
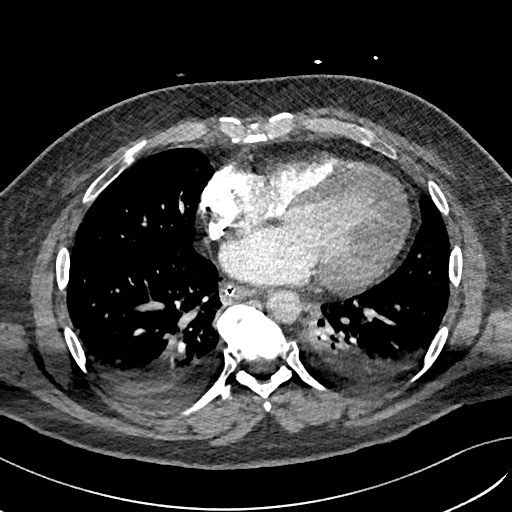
[im 287/479  lung]
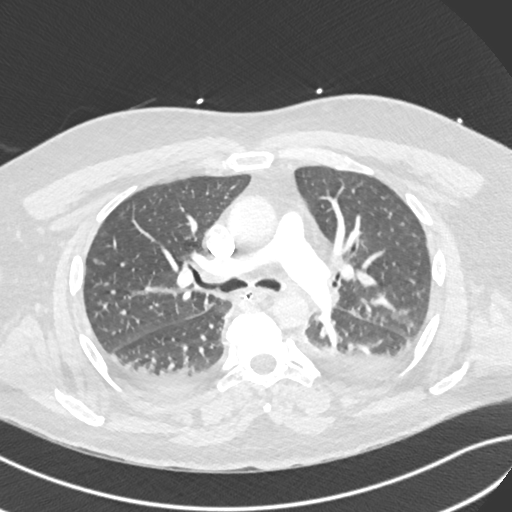
[im 383/479  soft-tissue]
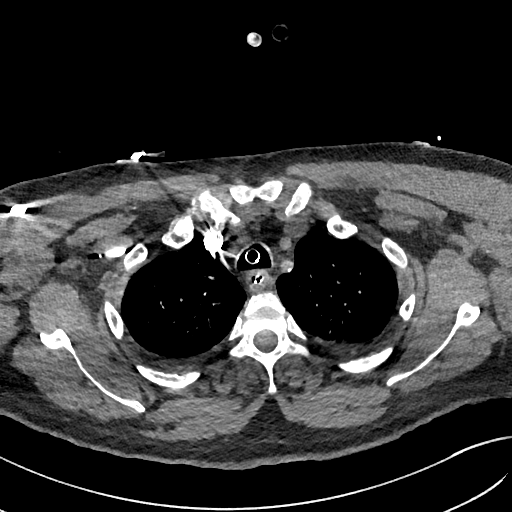

[4 of 16 positions shown; findings below may reference images not displayed]

FINDINGS: Cardiovascular: Satisfactory opacification of the pulmonary arteries
to the segmental level. No evidence of pulmonary embolism.
Cardiomegaly. Coronary artery calcifications. No pericardial
effusion.

Mediastinum/Nodes: No enlarged mediastinal, hilar, or axillary lymph
nodes. Thyroid gland, trachea, and esophagus demonstrate no
significant findings.

Lungs/Pleura: Endotracheal tube is positioned in mid trachea. Small
bilateral pleural effusions and associated atelectasis or
consolidation.

Upper Abdomen: No acute abnormality.

Musculoskeletal: No chest wall abnormality. No acute or significant
osseous findings.

Review of the MIP images confirms the above findings.
IMPRESSION: 1.  Negative examination for pulmonary embolism.

2. Small bilateral pleural effusions and associated atelectasis or
consolidation.

3.  Cardiomegaly and coronary artery disease.

4.  Endotracheal tube is positioned in the mid trachea.

## 2020-11-02 IMAGING — DX PORTABLE CHEST - 1 VIEW
2 series · 2 of 2 positions shown · non-contrast
Comparison: Chest radiograph and chest CT May 16, 2019

CLINICAL DATA: Hypoxia

EXAM:
PORTABLE CHEST 1 VIEW

[chest ap (1 of 2)]
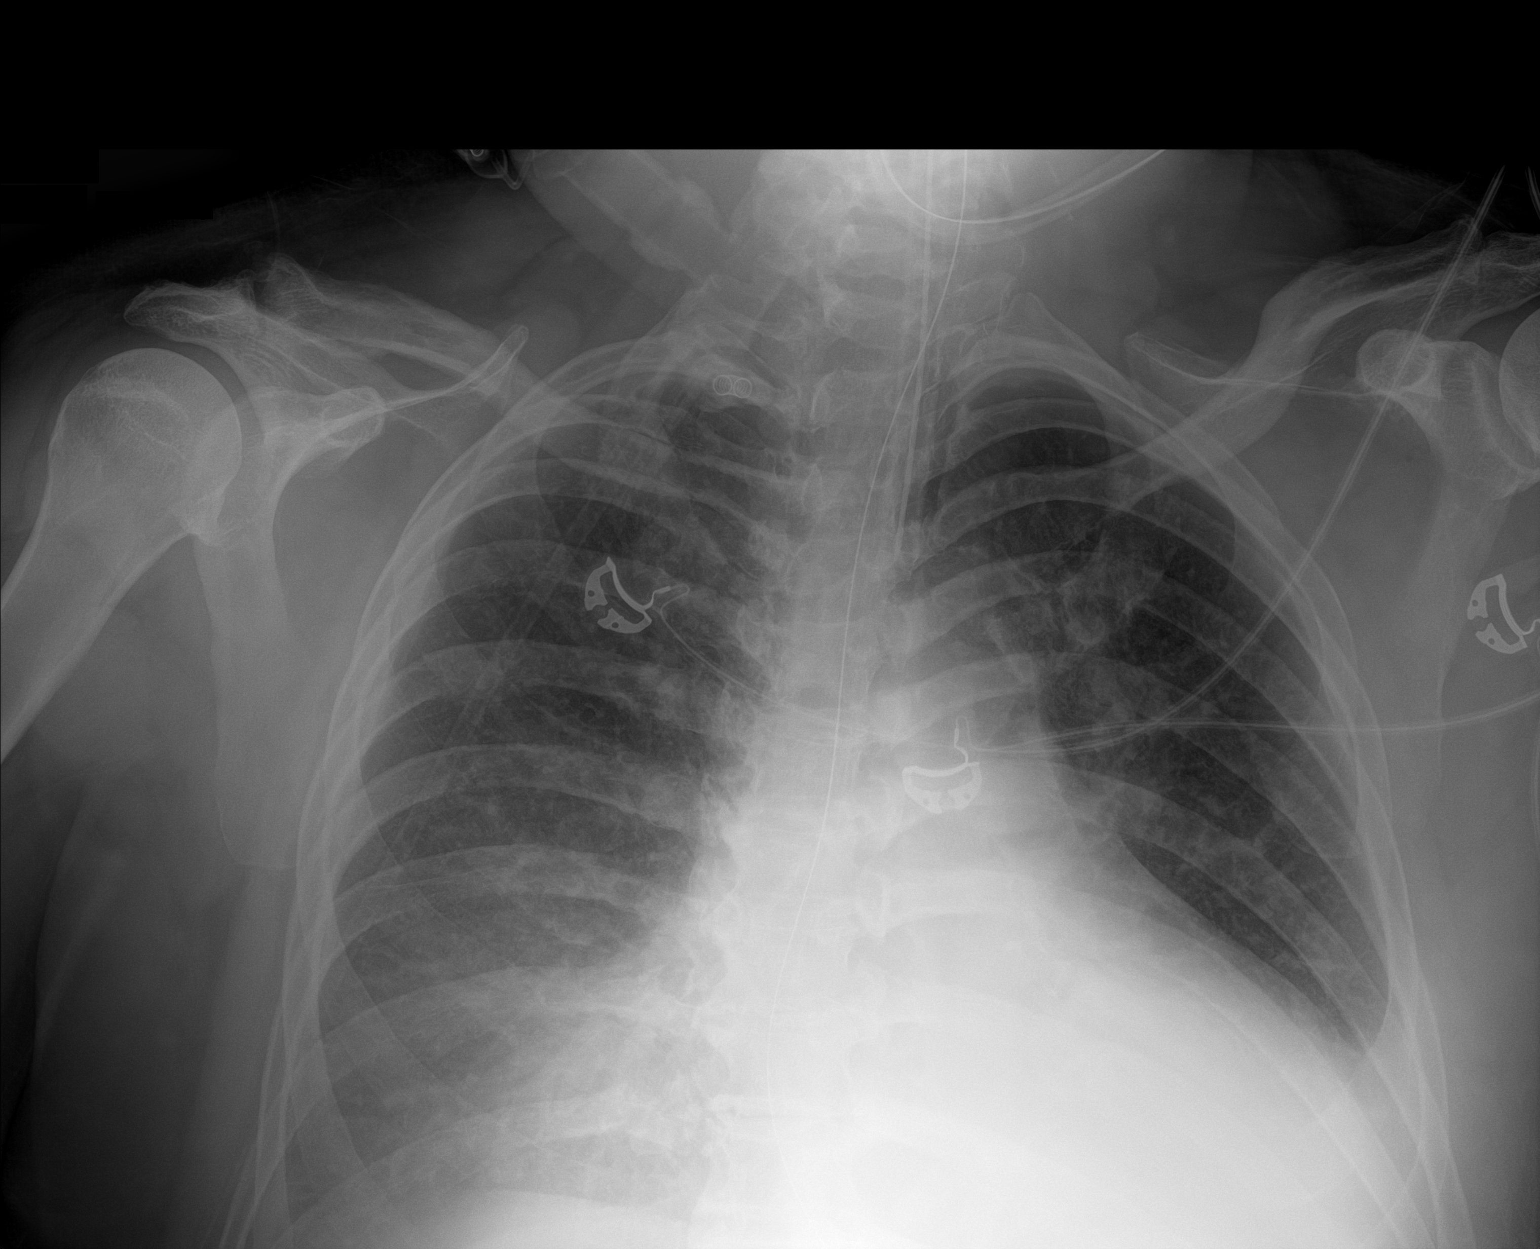

[chest ap (2 of 2)]
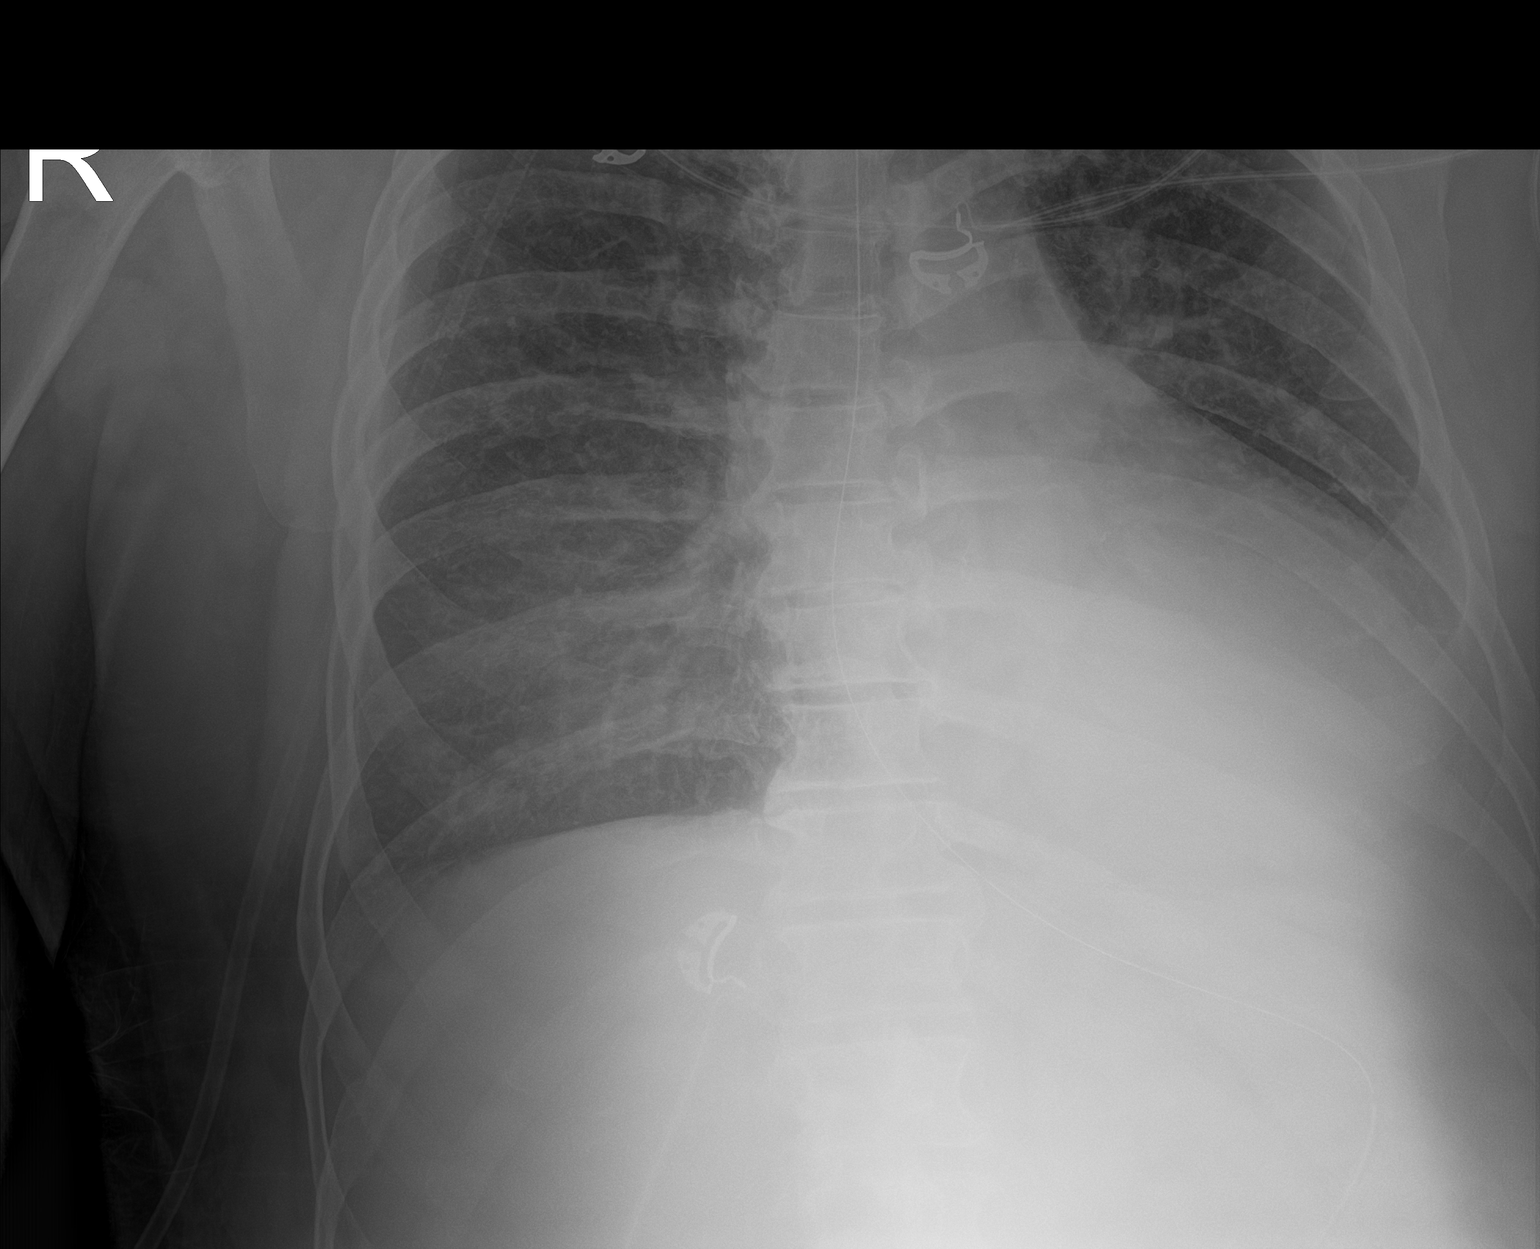

[2 of 2 positions shown; findings below may reference images not displayed]

FINDINGS: Endotracheal tube tip is 5.3 cm above the carina. Nasogastric tube
tip and side port are below the diaphragm. No pneumothorax. There is
airspace consolidation in the left lower lobe with left pleural
effusion. There is a smaller right pleural effusion. Heart is mildly
enlarged with pulmonary vascularity normal. No adenopathy. No bone
lesions.
IMPRESSION: Tube positions as described without pneumothorax. Left lower lobe
consolidation. Bilateral pleural effusions, slightly larger on the
left. Stable cardiac prominence.
